# Patient Record
Sex: Female | Born: 1940 | Race: Black or African American | Hispanic: No | Smoking: Former smoker
Health system: Southern US, Community
[De-identification: ages and names within clinical notes are randomized; demographics above are authoritative.]

## PROBLEM LIST (undated history)

## (undated) DIAGNOSIS — I10 Essential (primary) hypertension: Secondary | ICD-10-CM

## (undated) HISTORY — PX: CHOLECYSTECTOMY: SHX55

## (undated) HISTORY — PX: ABDOMINAL HYSTERECTOMY: SHX81

---

## 2018-05-27 ENCOUNTER — Other Ambulatory Visit: Payer: Self-pay

## 2018-05-27 ENCOUNTER — Emergency Department (HOSPITAL_BASED_OUTPATIENT_CLINIC_OR_DEPARTMENT_OTHER)
Admission: EM | Admit: 2018-05-27 | Discharge: 2018-05-28 | Disposition: A | Discharge status: Home / Self Care | Payer: Medicare Other | Attending: Emergency Medicine | Admitting: Emergency Medicine

## 2018-05-27 ENCOUNTER — Emergency Department (HOSPITAL_BASED_OUTPATIENT_CLINIC_OR_DEPARTMENT_OTHER): Payer: Medicare Other

## 2018-05-27 ENCOUNTER — Encounter (HOSPITAL_BASED_OUTPATIENT_CLINIC_OR_DEPARTMENT_OTHER): Payer: Self-pay | Admitting: *Deleted

## 2018-05-27 DIAGNOSIS — Z87891 Personal history of nicotine dependence: Secondary | ICD-10-CM | POA: Diagnosis not present

## 2018-05-27 DIAGNOSIS — Y92009 Unspecified place in unspecified non-institutional (private) residence as the place of occurrence of the external cause: Secondary | ICD-10-CM | POA: Insufficient documentation

## 2018-05-27 DIAGNOSIS — S8001XA Contusion of right knee, initial encounter: Secondary | ICD-10-CM | POA: Diagnosis not present

## 2018-05-27 DIAGNOSIS — Z7982 Long term (current) use of aspirin: Secondary | ICD-10-CM | POA: Insufficient documentation

## 2018-05-27 DIAGNOSIS — I10 Essential (primary) hypertension: Secondary | ICD-10-CM | POA: Insufficient documentation

## 2018-05-27 DIAGNOSIS — Y999 Unspecified external cause status: Secondary | ICD-10-CM | POA: Insufficient documentation

## 2018-05-27 DIAGNOSIS — W228XXA Striking against or struck by other objects, initial encounter: Secondary | ICD-10-CM | POA: Insufficient documentation

## 2018-05-27 DIAGNOSIS — Y9301 Activity, walking, marching and hiking: Secondary | ICD-10-CM | POA: Diagnosis not present

## 2018-05-27 DIAGNOSIS — S8991XA Unspecified injury of right lower leg, initial encounter: Secondary | ICD-10-CM | POA: Diagnosis present

## 2018-05-27 HISTORY — DX: Essential (primary) hypertension: I10

## 2018-05-27 NOTE — ED Triage Notes (Signed)
Pt c/o right knee injury x 4 days ago

## 2018-05-28 ENCOUNTER — Emergency Department (HOSPITAL_BASED_OUTPATIENT_CLINIC_OR_DEPARTMENT_OTHER): Payer: Medicare Other

## 2018-05-28 DIAGNOSIS — S8001XA Contusion of right knee, initial encounter: Secondary | ICD-10-CM | POA: Diagnosis not present

## 2018-05-28 MED ORDER — NAPROXEN 500 MG PO TABS: 500.0000 mg | ORAL_TABLET | Freq: Two times a day (BID) | ORAL | 0 refills | Status: DC | PRN

## 2018-05-28 MED ORDER — HYDROCODONE-ACETAMINOPHEN 5-325 MG PO TABS
1.0000 | ORAL_TABLET | Freq: Once | ORAL | Status: AC
  Administered 2018-05-28: 1 via ORAL
  Filled 2018-05-28: qty 1

## 2018-05-28 NOTE — ED Provider Notes (Signed)
[facility] EMERGENCY DEPARTMENT Provider Note   CSN: [id number] Arrival date & time: 05/27/18  2036     History   Chief Complaint Chief Complaint  Patient presents with  . Knee Injury    HPI Emily Barrett is a 77 y.o. female.  Patient with history of hypertension and knee arthritis presenting with worsening right knee pain after injuring it 4 days ago.  States she ran into a wooden coffee table with her right knee has been having severe pain since that is not controlled with Tylenol.  Denies falling or hitting her head.  No other injuries.  No neck or back pain.  Knee pain is on her anterior and lateral knee.  She is had intermittent issues with this knee in the past and told she would need a replacement.  Denies any hip pain or ankle pain.  The history is provided by the patient.    Past Medical History:  Diagnosis Date  . Hypertension     There are no active problems to display for this patient.   Past Surgical History:  Procedure Laterality Date  . ABDOMINAL HYSTERECTOMY    . CHOLECYSTECTOMY       OB History   None      Home Medications    Prior to Admission medications   Medication Sig Start Date End Date Taking? Authorizing Provider  lisinopril (PRINIVIL,ZESTRIL) 40 MG tablet TAKE 1 TABLET(40 MG) BY MOUTH DAILY 05/14/18  Yes [provider]  metoprolol tartrate (LOPRESSOR) 50 MG tablet Take by mouth. 12/25/17  Yes [provider]  aspirin EC 81 MG tablet Take by mouth.    [provider]  hydrochlorothiazide (HYDRODIURIL) 12.5 MG tablet Take by mouth.    [provider]    Family History History reviewed. No pertinent family history.  Social History Social History   Tobacco Use  . Smoking status: Former Games developer  . Smokeless tobacco: Never Used  Substance Use Topics  . Alcohol use: Not Currently  . Drug use: Not Currently     Allergies   Patient has no known allergies.   Review of Systems Review  of Systems  Constitutional: Negative for activity change, appetite change and fever.  HENT: Negative for congestion and rhinorrhea.   Eyes: Negative for visual disturbance.  Respiratory: Negative for cough, chest tightness and shortness of breath.   Cardiovascular: Negative for chest pain.  Gastrointestinal: Negative for abdominal pain, nausea and vomiting.  Genitourinary: Negative for dysuria, hematuria, vaginal bleeding and vaginal discharge.  Musculoskeletal: Positive for arthralgias and myalgias. Negative for back pain and neck pain.  Neurological: Negative for dizziness, weakness, light-headedness and headaches.    all other systems are negative except as noted in the HPI and PMH.    Physical Exam Updated Vital Signs BP (!) 160/89 (BP Location: Right Arm)   Pulse 68   Temp 98.6 F (37 C) (Oral)   Resp 18   Ht 5\' 5"  (1.651 m)   Wt 93 kg   SpO2 100%   BMI 34.11 kg/m   Physical Exam  Constitutional: She is oriented to person, place, and time. She appears well-developed and well-nourished. No distress.  HENT:  Head: Normocephalic and atraumatic.  Mouth/Throat: Oropharynx is clear and moist. No oropharyngeal exudate.  Eyes: Pupils are equal, round, and reactive to light. Conjunctivae and EOM are normal.  Neck: Normal range of motion. Neck supple.  No meningismus.  Cardiovascular: Normal rate, regular rhythm, normal heart sounds and intact distal pulses.  No murmur heard. Pulmonary/Chest: Effort normal and breath sounds normal. No respiratory distress.  Abdominal: Soft. There is no tenderness. There is no rebound and no guarding.  Musculoskeletal: Normal range of motion. She exhibits tenderness. She exhibits no edema.  Right knee has anterior and lateral joint line tenderness.  No significant effusion.  Pain with range of motion.  Drawer sign is negative.  Patient able to lift leg and keep knee extended. Intact DP and PT pulses. Tenderness to palpation across the tibial  plateau  Neurological: She is alert and oriented to person, place, and time. No cranial nerve deficit. She exhibits normal muscle tone. Coordination normal.  No ataxia on finger to nose bilaterally. No pronator drift. 5/5 strength throughout. CN 2-12 intact.Equal grip strength. Sensation intact.   Skin: Skin is warm.  Psychiatric: She has a normal mood and affect. Her behavior is normal.  Nursing note and vitals reviewed.    ED Treatments / Results  Labs (all labs ordered are listed, but only abnormal results are displayed) Labs Reviewed - No data to display  EKG None  Radiology Ct Knee Right Wo Contrast  Result Date: 05/28/2018 CLINICAL DATA:  Knee trauma, tenderness or effusion or can't walk, initial exam. Right knee injury 4 days ago. EXAM: CT OF THE RIGHT KNEE WITHOUT CONTRAST TECHNIQUE: Multidetector CT imaging of the RIGHT knee was performed according to the standard protocol. Multiplanar CT image reconstructions were also generated. COMPARISON:  Radiographs 3 hours prior. FINDINGS: Bones/Joint/Cartilage No fracture or dislocation. Advanced tricompartmental spurring with spurring of the tibial spines. Prominent medial tibiofemoral and patellofemoral joint space narrowing. Intra-articular bodies anteriorly in the joint. Subchondral cysts at the intercondylar notch. Chronic bony fragmentation about the proximal tibia/fibular articulation. Small joint effusion without lipohemarthrosis. Ligaments Suboptimally assessed by CT. Muscles and Tendons No intramuscular hematoma. Patellar tendon is intact. Mild thickening of the quadriceps tendon which is likely chronic. Soft tissues Soft tissue edema anteriorly.  No confluent hematoma. IMPRESSION: 1. 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 Intra-articular body anteriorly. 2. Small joint effusion. Electronically Signed   By: Narda Rutherford M.D.   On: 05/28/2018 01:25   Dg Knee Complete 4 Views Right  Result Date:  05/27/2018 CLINICAL DATA:  Pain. EXAM: RIGHT KNEE - COMPLETE 4+ VIEW COMPARISON:  None. FINDINGS: No significant joint effusion. Moderate to severe tricompartmental degenerative changes. No acute fractures noted. IMPRESSION: Moderate to severe tricompartmental degenerative changes. Electronically Signed   By: Gerome Sam III M.D   On: 05/27/2018 21:36    Procedures Procedures (including critical care time)  Medications Ordered in ED Medications  HYDROcodone-acetaminophen (NORCO/VICODIN) 5-325 MG per tablet 1 tablet (has no administration in time range)     Initial Impression / Assessment and Plan / ED Course  I have reviewed the triage vital signs and the nursing notes.  Pertinent labs & imaging results that were available during my care of the patient were reviewed by me and considered in my medical decision making (see chart for details).    Right knee pain after direct trauma several days ago.  Neurovascularly intact.  X-rays show severe osteoarthritis without acute pathology  CT scan is also negative for fracture or dislocation.  Quadriceps and patellar tendon appears to be intact.  Patient given knee sleeve, anti-inflammatories and follow-up with her orthopedist.  Return precautions discussed. Final Clinical Impressions(s) / ED Diagnoses   Final diagnoses:  Contusion of right knee, initial encounter    ED Discharge Orders    None  Glynn Octave, MD 05/28/18 [phone]

## 2018-05-28 NOTE — Discharge Instructions (Addendum)
Your testing does not show any fracture.  Follow-up with your orthopedic specialist.  Return to the ED if you develop new or worsening symptoms.

## 2018-06-09 ENCOUNTER — Emergency Department (HOSPITAL_BASED_OUTPATIENT_CLINIC_OR_DEPARTMENT_OTHER): Payer: Medicare Other

## 2018-06-09 ENCOUNTER — Other Ambulatory Visit: Payer: Self-pay

## 2018-06-09 ENCOUNTER — Encounter (HOSPITAL_BASED_OUTPATIENT_CLINIC_OR_DEPARTMENT_OTHER): Payer: Self-pay

## 2018-06-09 ENCOUNTER — Observation Stay (HOSPITAL_BASED_OUTPATIENT_CLINIC_OR_DEPARTMENT_OTHER)
Admission: EM | Admit: 2018-06-09 | Discharge: 2018-06-11 | Disposition: A | Discharge status: Home / Self Care | Payer: Medicare Other | Attending: Internal Medicine | Admitting: Internal Medicine

## 2018-06-09 ENCOUNTER — Observation Stay (HOSPITAL_COMMUNITY): Payer: Medicare Other

## 2018-06-09 DIAGNOSIS — G9341 Metabolic encephalopathy: Secondary | ICD-10-CM | POA: Diagnosis not present

## 2018-06-09 DIAGNOSIS — Z79899 Other long term (current) drug therapy: Secondary | ICD-10-CM | POA: Diagnosis not present

## 2018-06-09 DIAGNOSIS — G459 Transient cerebral ischemic attack, unspecified: Secondary | ICD-10-CM | POA: Diagnosis not present

## 2018-06-09 DIAGNOSIS — I10 Essential (primary) hypertension: Secondary | ICD-10-CM | POA: Diagnosis not present

## 2018-06-09 DIAGNOSIS — R4182 Altered mental status, unspecified: Secondary | ICD-10-CM | POA: Diagnosis present

## 2018-06-09 DIAGNOSIS — Z87891 Personal history of nicotine dependence: Secondary | ICD-10-CM | POA: Diagnosis not present

## 2018-06-09 LAB — PROTIME-INR
INR: 0.99
PROTHROMBIN TIME: 13 s (ref 11.4–15.2)

## 2018-06-09 LAB — COMPREHENSIVE METABOLIC PANEL
ALT: 17 U/L (ref 0–44)
ANION GAP: 10 (ref 5–15)
AST: 21 U/L (ref 15–41)
Albumin: 4 g/dL (ref 3.5–5.0)
Alkaline Phosphatase: 60 U/L (ref 38–126)
BUN: 19 mg/dL (ref 8–23)
CHLORIDE: 105 mmol/L (ref 98–111)
CO2: 26 mmol/L (ref 22–32)
Calcium: 9.8 mg/dL (ref 8.9–10.3)
Creatinine, Ser: 0.61 mg/dL (ref 0.44–1.00)
GFR calc non Af Amer: >60 mL/min (ref >60)
Glucose, Bld: 103 mg/dL — ABNORMAL HIGH (ref 70–99)
Potassium: 4.2 mmol/L (ref 3.5–5.1)
Sodium: 141 mmol/L (ref 135–145)
Total Bilirubin: 0.8 mg/dL (ref 0.3–1.2)
Total Protein: 7.8 g/dL (ref 6.5–8.1)

## 2018-06-09 LAB — URINALYSIS, ROUTINE W REFLEX MICROSCOPIC
Bilirubin Urine: NEGATIVE
GLUCOSE, UA: NEGATIVE mg/dL
HGB URINE DIPSTICK: NEGATIVE
Ketones, ur: NEGATIVE mg/dL
LEUKOCYTES UA: NEGATIVE
Nitrite: NEGATIVE
PROTEIN: 100 mg/dL — AB
Specific Gravity, Urine: 1.025 (ref 1.005–1.030)
pH: 6 (ref 5.0–8.0)

## 2018-06-09 LAB — URINALYSIS, MICROSCOPIC (REFLEX)

## 2018-06-09 LAB — RAPID URINE DRUG SCREEN, HOSP PERFORMED
AMPHETAMINES: NOT DETECTED
Barbiturates: NOT DETECTED
Benzodiazepines: NOT DETECTED
COCAINE: NOT DETECTED
Opiates: NOT DETECTED
TETRAHYDROCANNABINOL: NOT DETECTED

## 2018-06-09 LAB — ETHANOL

## 2018-06-09 LAB — CBG MONITORING, ED: Glucose-Capillary: 99 mg/dL (ref 70–99)

## 2018-06-09 LAB — DIFFERENTIAL
Abs Immature Granulocytes: 0.02 10*3/uL (ref 0.00–0.07)
BASOS ABS: 0 10*3/uL (ref 0.0–0.1)
BASOS PCT: 0 %
EOS PCT: 1 %
Eosinophils Absolute: 0.1 10*3/uL (ref 0.0–0.5)
Immature Granulocytes: 0 %
Lymphocytes Relative: 52 %
Lymphs Abs: 3.8 10*3/uL (ref 0.7–4.0)
MONO ABS: 0.8 10*3/uL (ref 0.1–1.0)
Monocytes Relative: 11 %
NEUTROS ABS: 2.6 10*3/uL (ref 1.7–7.7)
NEUTROS PCT: 36 %

## 2018-06-09 LAB — CBC
HCT: 47.9 % — ABNORMAL HIGH (ref 36.0–46.0)
HEMOGLOBIN: 14.9 g/dL (ref 12.0–15.0)
MCH: 28.5 pg (ref 26.0–34.0)
MCHC: 31.1 g/dL (ref 30.0–36.0)
MCV: 91.6 fL (ref 80.0–100.0)
Platelets: 284 10*3/uL (ref 150–400)
RBC: 5.23 MIL/uL — ABNORMAL HIGH (ref 3.87–5.11)
RDW: 14.5 % (ref 11.5–15.5)
WBC: 7.3 10*3/uL (ref 4.0–10.5)
nRBC: 0 % (ref 0.0–0.2)

## 2018-06-09 LAB — APTT: aPTT: 24 seconds (ref 24–36)

## 2018-06-09 LAB — TROPONIN I: Troponin I: <0.03 ng/mL (ref <0.03)

## 2018-06-09 MED ORDER — ONDANSETRON HCL 4 MG/2ML IJ SOLN: 4.0000 mg | Freq: Three times a day (TID) | INTRAMUSCULAR | Status: DC | PRN

## 2018-06-09 MED ORDER — STROKE: EARLY STAGES OF RECOVERY BOOK
Freq: Once | Status: AC
  Administered 2018-06-09: 23:00:00

## 2018-06-09 MED ORDER — ACETAMINOPHEN 160 MG/5ML PO SOLN: 650.0000 mg | ORAL | Status: DC | PRN

## 2018-06-09 MED ORDER — HYDRALAZINE HCL 20 MG/ML IJ SOLN: 5.0000 mg | INTRAMUSCULAR | Status: DC | PRN

## 2018-06-09 MED ORDER — SENNOSIDES-DOCUSATE SODIUM 8.6-50 MG PO TABS: 1.0000 | ORAL_TABLET | Freq: Every evening | ORAL | Status: DC | PRN

## 2018-06-09 MED ORDER — ENOXAPARIN SODIUM 40 MG/0.4ML ~~LOC~~ SOLN
40.0000 mg | SUBCUTANEOUS | Status: DC
  Administered 2018-06-09 – 2018-06-10 (×2): 40 mg via SUBCUTANEOUS
  Filled 2018-06-09 (×2): qty 0.4

## 2018-06-09 MED ORDER — ASPIRIN 300 MG RE SUPP: 300.0000 mg | Freq: Every day | RECTAL | Status: DC

## 2018-06-09 MED ORDER — ATORVASTATIN CALCIUM 80 MG PO TABS
80.0000 mg | ORAL_TABLET | Freq: Every day | ORAL | Status: DC
  Administered 2018-06-09 – 2018-06-11 (×3): 80 mg via ORAL
  Filled 2018-06-09 (×3): qty 1

## 2018-06-09 MED ORDER — SODIUM CHLORIDE 0.9 % IV SOLN
INTRAVENOUS | Status: DC
  Administered 2018-06-09 – 2018-06-11 (×3): via INTRAVENOUS

## 2018-06-09 MED ORDER — ACETAMINOPHEN 650 MG RE SUPP: 650.0000 mg | RECTAL | Status: DC | PRN

## 2018-06-09 MED ORDER — ACETAMINOPHEN 325 MG PO TABS: 650.0000 mg | ORAL_TABLET | ORAL | Status: DC | PRN

## 2018-06-09 MED ORDER — ZOLPIDEM TARTRATE 5 MG PO TABS
5.0000 mg | ORAL_TABLET | Freq: Every evening | ORAL | Status: DC | PRN
  Administered 2018-06-09: 5 mg via ORAL
  Filled 2018-06-09: qty 1

## 2018-06-09 MED ORDER — ASPIRIN 325 MG PO TABS
325.0000 mg | ORAL_TABLET | Freq: Every day | ORAL | Status: DC
  Administered 2018-06-10 – 2018-06-11 (×2): 325 mg via ORAL
  Filled 2018-06-09 (×2): qty 1

## 2018-06-09 NOTE — ED Notes (Signed)
Pt given microwave dinner, Pt family member approached this RN to report pt was having issues swallowing. Pt told to remain NPO until further notice. Pt breath sounds clear- pt denies any cough. Pt appears to be in NAD.

## 2018-06-09 NOTE — ED Notes (Signed)
Pt on monitor 

## 2018-06-09 NOTE — ED Notes (Signed)
[facility] arrived to transport pt to [facility] 

## 2018-06-09 NOTE — H&P (Addendum)
History and Physical    Emily Barrett WUJ:[id number] DOB: 1940-09-30 DOA: 06/09/2018  Referring MD/NP/PA:   PCP: Patient, No Pcp Per   Patient coming from:  The patient is coming from home.  At baseline, pt is independent for most of ADL.   Chief Complaint: AMS and difficulty speaking  HPI: Emily Barrett is a 77 y.o. female with medical history significant of hypertension, HOH, who presents with altered mental status and difficulty speaking.  Per her daughter, patient was last known normal at about 12:00, but she suddenly became confused at about 12:15.  She had difficult speaking out.  She did not have vision loss, facial droop, unilateral weakness, numbness or tingling his extremities.  Patient has chronic bilateral hearing loss, which has not changed.  Per her daughter, symptoms lasted for about 30 minutes, then resolved spontaneously.  Patient denies any chest pain, shortness breath, cough, fever or chills.  She had one loose stool bowel movement earlier, currently no nausea vomiting, diarrhea or abdominal pain.  Denies symptoms of UTI. When I saw pt on the floor, she is alert, oriented x3.  No unilateral weakness, facial droop, slurred speech.  No difficulty speaking.  ED Course: pt was found to have WBC 7.3, INR 0.99, PTT 24, negative troponin, negative urinalysis, alcohol level less than 10, electrolytes renal function okay, blood pressure 189/100, bradycardia, oxygen saturation 96% on room air, temperature normal.  Chest x-ray negative.  CT head is negative for acute intracranial abnormalities.  Patient is placed on telemetry bed of observation.  Neurology was consulted by EDP (Neurology (accepting MD did not Neurologist name).  Review of Systems:   General: no fevers, chills, no body weight gain, has fatigue HEENT: no blurry vision, hearing changes or sore throat Respiratory: no dyspnea, coughing, wheezing CV: no chest pain, no palpitations GI: no nausea, vomiting, abdominal pain,  diarrhea, constipation GU: no dysuria, burning on urination, increased urinary frequency, hematuria  Ext: no leg edema Neuro: has confusion and difficulty speaking. Skin: no rash, no skin tear. MSK: No muscle spasm, no deformity, no limitation of range of movement in spin Heme: No easy bruising.  Travel history: No recent long distant travel.  Allergy: No Known Allergies  Past Medical History:  Diagnosis Date  . Hypertension     Past Surgical History:  Procedure Laterality Date  . ABDOMINAL HYSTERECTOMY    . CHOLECYSTECTOMY      Social History:  reports that she has quit smoking. She has never used smokeless tobacco. She reports that she drank alcohol. She reports that she has current or past drug history.  Family History:  Family History  Problem Relation Age of Onset  . Dementia Mother   . Heart disease Father   . Diabetes Mellitus II Brother   . Hypertension Brother      Prior to Admission medications   Medication Sig Start Date End Date Taking? Authorizing Provider  aspirin EC 81 MG tablet Take by mouth.    [provider]  hydrochlorothiazide (HYDRODIURIL) 12.5 MG tablet Take by mouth.    [provider]  lisinopril (PRINIVIL,ZESTRIL) 40 MG tablet TAKE 1 TABLET(40 MG) BY MOUTH DAILY 05/14/18   [provider]  metoprolol tartrate (LOPRESSOR) 50 MG tablet Take by mouth. 12/25/17   [provider]  naproxen (NAPROSYN) 500 MG tablet Take 1 tablet (500 mg total) by mouth 2 (two) times daily as needed. 05/28/18   Glynn Octave, MD    Physical Exam: Vitals:   06/09/18 1700  06/09/18 1730 06/09/18 1848 06/09/18 1956  BP: (!) 151/81 (!) 164/78 (!) 184/88 (!) 197/100  Pulse: 66  80 77  Resp: (!) 21 14 12 12   Temp:   98.3 F (36.8 C) 99.4 F (37.4 C)  TempSrc:   Oral Oral  SpO2: 96% 96% 99% 100%  Weight:      Height:   5\' 5"  (1.651 m)    General: Not in acute distress HEENT:       Eyes: PERRL, EOMI, no scleral icterus.       ENT:  No discharge from the ears and nose, no pharynx injection, no tonsillar enlargement.        Neck: No JVD, no bruit, no mass felt. Heme: No neck lymph node enlargement. Cardiac: S1/S2, RRR, No murmurs, No gallops or rubs. Respiratory: No rales, wheezing, rhonchi or rubs. GI: Soft, nondistended, nontender, no rebound pain, no organomegaly, BS present. GU: No hematuria Ext: No pitting leg edema bilaterally. 2+DP/PT pulse bilaterally. Musculoskeletal: No joint deformities, No joint redness or warmth, no limitation of ROM in spin. Skin: No rashes.  Neuro: Alert, oriented X3, cranial nerves II-XII grossly intact, moves all extremities normally. Muscle strength 5/5 in all extremities, sensation to light touch intact. Brachial reflex 2+ bilaterally. Negative Babinski's sign.  Psych: Patient is not psychotic, no suicidal or hemocidal ideation.  Labs on Admission: I have personally reviewed following labs and imaging studies  CBC: Recent Labs  Lab 06/09/18 1339  WBC 7.3  NEUTROABS 2.6  HGB 14.9  HCT 47.9*  MCV 91.6  PLT 284   Basic Metabolic Panel: Recent Labs  Lab 06/09/18 1339  NA 141  K 4.2  CL 105  CO2 26  GLUCOSE 103*  BUN 19  CREATININE 0.61  CALCIUM 9.8   GFR: Estimated Creatinine Clearance: 67.9 mL/min (by C-G formula based on SCr of 0.61 mg/dL). Liver Function Tests: Recent Labs  Lab 06/09/18 1339  AST 21  ALT 17  ALKPHOS 60  BILITOT 0.8  PROT 7.8  ALBUMIN 4.0   No results for input(s): LIPASE, AMYLASE in the last 168 hours. No results for input(s): AMMONIA in the last 168 hours. Coagulation Profile: Recent Labs  Lab 06/09/18 1339  INR 0.99   Cardiac Enzymes: Recent Labs  Lab 06/09/18 1339  TROPONINI <0.03   BNP (last 3 results) No results for input(s): PROBNP in the last 8760 hours. HbA1C: No results for input(s): HGBA1C in the last 72 hours. CBG: Recent Labs  Lab 06/09/18 1328  GLUCAP 99   Lipid Profile: No results for input(s): CHOL, HDL,  LDLCALC, TRIG, CHOLHDL, LDLDIRECT in the last 72 hours. Thyroid Function Tests: No results for input(s): TSH, T4TOTAL, FREET4, T3FREE, THYROIDAB in the last 72 hours. Anemia Panel: No results for input(s): VITAMINB12, FOLATE, FERRITIN, TIBC, IRON, RETICCTPCT in the last 72 hours. Urine analysis:    Component Value Date/Time   COLORURINE YELLOW 06/09/2018 1330   APPEARANCEUR CLEAR 06/09/2018 1330   LABSPEC 1.025 06/09/2018 1330   PHURINE 6.0 06/09/2018 1330   GLUCOSEU NEGATIVE 06/09/2018 1330   HGBUR NEGATIVE 06/09/2018 1330   BILIRUBINUR NEGATIVE 06/09/2018 1330   KETONESUR NEGATIVE 06/09/2018 1330   PROTEINUR 100 (A) 06/09/2018 1330   NITRITE NEGATIVE 06/09/2018 1330   LEUKOCYTESUR NEGATIVE 06/09/2018 1330   Sepsis Labs: @LABRCNTIP (procalcitonin:4,lacticidven:4) )No results found for this or any previous visit (from the past 240 hour(s)).   Radiological Exams on Admission: Dg Chest 2 View  Result Date: 06/09/2018 CLINICAL DATA:  Increased confusion EXAM:  CHEST - 2 VIEW COMPARISON:  None. FINDINGS: Cardiac shadow is at the upper limits of normal in size. The lungs are well aerated bilaterally on the frontal film but somewhat hypoaerated on the lateral suggesting basilar atelectasis. This is felt to be related to the poor inspiratory effort. No bony abnormality is seen. Degenerative change of thoracic spine is noted. IMPRESSION: Poor inspiratory effort on the lateral projection. No acute abnormality noted. Electronically Signed   By: Alcide Clever M.D.   On: 06/09/2018 16:11   Ct Head Wo Contrast  Result Date: 06/09/2018 CLINICAL DATA:  Focal neuro deficit. Altered mental status confusion EXAM: CT HEAD WITHOUT CONTRAST TECHNIQUE: Contiguous axial images were obtained from the base of the skull through the vertex without intravenous contrast. COMPARISON:  CT head 12/25/2017 FINDINGS: Brain: Progressive moderate atrophy. Progression of diffuse white matter hypodensity. No mass-effect or  midline shift. Negative for acute infarct, hemorrhage, or mass. Sella remains markedly enlarged and filled with CSF. No pituitary tissue identified. Findings consistent with empty sella. Vascular: Negative for hyperdense vessel Skull: Negative Sinuses/Orbits: Paranasal sinuses clear.  Normal orbit. Other: None IMPRESSION: Since 01/14/2018, there has been progression of atrophy and diffuse white matter disease. No acute abnormality. Consider MRI head without and with contrast for further evaluation. Electronically Signed   By: Marlan Palau M.D.   On: 06/09/2018 14:01     EKG: Independently reviewed.  Sinus rhythm, QTC 475, PVC, early R wave progression Assessment/Plan Principal Problem:   TIA (transient ischemic attack) Active Problems:   Acute metabolic encephalopathy   Essential hypertension   TIA (transient ischemic attack): Patient had transient confusion and difficulty speaking, which is resolved in 30 minutes.  Concerning for TIA.  CT head showed atrophy and diffuse white matter disease, but no acute intracranial abnormalities.  Neurology was consulted by EDP (Neurology (accepting MD did not Neurologist name).  - will place on tele bed for obs - will follow up Neurology's Recs.  - Obtain MRI/MRA  - allow permissive HTN in the setting of acute stroke  - Check carotid dopplers  - start Lipitor 80 mg daily and ASA - fasting lipid panel and HbA1c  - 2D transthoracic echocardiography  - swallowing screen. If fails, will get SLP - Check UDS  - PT/OT consult  Addendum: MRI negative for stroke. Dr. Amada Jupiter suspect patient may have complex partial seizure.  He ordered EEG. -Seizure precaution -PRN Ativan for seizure  Acute metabolic encephalopathy: due to TIA, resolved. -Frequent neuro check  Essential hypertension: -Hold home blood pressure medications to allow permissive hypertension -IV hydralazine PRN for BP>220/120   DVT ppx:   SQ Lovenox Code Status: Full code Family  Communication:  Yes, patient's 4 daughters, and son-in-law   at bed side Disposition Plan:  Anticipate discharge back to previous home environment Consults called: Neurology (accepting MD did not Neurologist name) Admission status: Obs / tele   Date of Service 06/09/2018    Emily Barrett [facility] Pager [phone]  If 7PM-7AM, please contact night-coverage [email or web address] Password [facility] 06/09/2018, 9:13 PM

## 2018-06-09 NOTE — ED Notes (Signed)
Pt up to bedside commode. MD in to see patient. Plan for admission.

## 2018-06-09 NOTE — Consult Note (Signed)
Neurology Consultation Reason for Consult: Transient episode of confusion Referring Physician: Jarvis Morgan  CC: Episode of confusion  History is obtained from: Patient, sister  HPI: Emily Barrett is a 77 y.o. female with a history of hypertension who presents with transient episode of confusion.  She was sitting at the breakfast table talking to her daughter when she had abrupt cessation and speaking.  Her daughter states that she was just looking around like she was lost, not fixedly staring, and no gaze deviation but she did seem to prefer looking towards the right.  She did not say anything for a couple of minutes and then when she began speaking she seemed to have some difficulty.  Her daughter asked her to scratch her back and the patient instead just picked at the clothing where the daughter had indicated to scratch.  She got her into the car to take her back to her house and she continued to have some difficulty with speaking during this.  She got her back to the daughter's house which point the patient spoke to the daughter's husband and answered appropriately.  She estimates that the total time from onset of symptoms to resolution of symptoms was a little bit over 10 minutes.  The patient states that she is completely amnestic to the episode.  She remembers sitting at the breakfast table with her sister, and then the next thing she remembers is being at her sister's house.  She does not remember getting into the car or any transit there.   Per family, she has been having some difficulty with short-term memory problems.  She does have a history of dementia in the family.    ROS: A 14 point ROS was performed and is negative except as noted in the HPI.  Past Medical History:  Diagnosis Date  . Hypertension      Family History  Problem Relation Age of Onset  . Dementia Mother   . Heart disease Father   . Diabetes Mellitus II Brother   . Hypertension Brother      Social History:   reports that she has quit smoking. She has never used smokeless tobacco. She reports that she drank alcohol. She reports that she has current or past drug history.   Exam: Current vital signs: BP (!) 182/87 (BP Location: Right Arm)   Pulse 78   Temp 98.7 F (37.1 C) (Oral)   Resp 18   Ht 5\' 5"  (1.651 m)   Wt 94.3 kg   SpO2 98%   BMI 34.61 kg/m  Vital signs in last 24 hours: Temp:  [98.2 F (36.8 C)-99.4 F (37.4 C)] 98.7 F (37.1 C) (10/15 2335) Pulse Rate:  [58-80] 78 (10/15 2335) Resp:  [12-25] 18 (10/15 2335) BP: (151-197)/(59-100) 182/87 (10/15 2335) SpO2:  [96 %-100 %] 98 % (10/15 2335) Weight:  [94.3 kg] 94.3 kg (10/15 1314)   Physical Exam  Constitutional: Appears well-developed and well-nourished.  Psych: Affect appropriate to situation Eyes: No scleral injection HENT: No OP obstrucion Head: Normocephalic.  Cardiovascular: Normal rate and regular rhythm.  Respiratory: Effort normal, non-labored breathing GI: Soft.  No distension. There is no tenderness.  Skin: WDI  Neuro: Mental Status: Patient is awake, alert, oriented to person, place, month, year, and situation. Patient is able to give a clear and coherent history. No signs of aphasia or neglect Cranial Nerves: II: Visual Fields are full. Pupils are equal, round, and reactive to light.   III,IV, VI: EOMI without ptosis or  diploplia.  V: Facial sensation is symmetric to temperature VII: Facial movement is symmetric.  VIII: hearing is intact to voice X: Uvula elevates symmetrically XI: Shoulder shrug is symmetric. XII: tongue is midline without atrophy or fasciculations.  Motor: Tone is normal. Bulk is normal. 5/5 strength was present in all four extremities.  Plantars: Toes are downgoing bilaterally.  Cerebellar: FNF intact bilaterally   I have reviewed labs in epic and the results pertinent to this consultation are: CMP-unremarkable  I have reviewed the images obtained: MRI brain-no acute  infarct  Impression: 77 year old female who presents with transient episode of behavioral arrest.  The fact that she is completely amnestic to the episode would argue much more in favor of complex partial seizure as opposed to transient ischemic attack.  No evidence of hypoglycemia, hypotension, or other etiology that is apparent to me at this time.  If an EEG does demonstrate a seizure predisposition, then I would start antiepileptic therapy.  Given that there is some doubt about the diagnosis, I do not think that I would start antiepileptic without further evidence of a seizure predisposition.  Recommendations: 1) EEG 2) I have discussed no driving with the patient no driving for 6 months. 3) she is already on antiplatelet therapy with aspirin.  Dopplers and echo are pending, though unless there was evidence on the studies that she was at very high risk, I suspect that TIA is less likely. 4) she does have intracranial atherosclerosis, and I would consider statin therapy if she has an elevated LDL.  Ritta Slot, MD [facility] [phone]  If 7pm- 7am, please page neurology on call as listed in AMION.

## 2018-06-09 NOTE — ED Notes (Signed)
Patient transported to X-ray 

## 2018-06-09 NOTE — ED Notes (Signed)
ED Provider at bedside. 

## 2018-06-09 NOTE — ED Notes (Signed)
Eugenie Norrie RN on [address] informed pt is now NPO

## 2018-06-09 NOTE — ED Notes (Signed)
Patient transported to CT 

## 2018-06-09 NOTE — ED Triage Notes (Signed)
Per daughter she started a phone conversation with pt at 12p-pt was WNL-by 1215p pt seemed confused and unable to get words out-pt entered triage with daughter/slow steady gait-pt is alert to name, DOB-states she had a bday yesterday-answers ?s appropriately with daughter filling in gaps-denies pain-pt taken to tx area via w/c

## 2018-06-10 ENCOUNTER — Observation Stay (HOSPITAL_BASED_OUTPATIENT_CLINIC_OR_DEPARTMENT_OTHER): Payer: Medicare Other

## 2018-06-10 ENCOUNTER — Observation Stay (HOSPITAL_COMMUNITY): Payer: Medicare Other

## 2018-06-10 DIAGNOSIS — G9341 Metabolic encephalopathy: Secondary | ICD-10-CM | POA: Diagnosis not present

## 2018-06-10 DIAGNOSIS — G459 Transient cerebral ischemic attack, unspecified: Secondary | ICD-10-CM

## 2018-06-10 DIAGNOSIS — I10 Essential (primary) hypertension: Secondary | ICD-10-CM | POA: Diagnosis not present

## 2018-06-10 LAB — LIPID PANEL
CHOL/HDL RATIO: 3.9 ratio
CHOLESTEROL: 140 mg/dL (ref 0–200)
HDL: 36 mg/dL — ABNORMAL LOW (ref >40)
LDL Cholesterol: 87 mg/dL (ref 0–99)
Triglycerides: 84 mg/dL (ref <150)
VLDL: 17 mg/dL (ref 0–40)

## 2018-06-10 LAB — ECHOCARDIOGRAM COMPLETE
Height: 65 in
WEIGHTICAEL: 3328 [oz_av]

## 2018-06-10 LAB — HEMOGLOBIN A1C
Hgb A1c MFr Bld: 6.1 % — ABNORMAL HIGH (ref 4.8–5.6)
Mean Plasma Glucose: 128.37 mg/dL

## 2018-06-10 MED ORDER — METOPROLOL TARTRATE 50 MG PO TABS
50.0000 mg | ORAL_TABLET | Freq: Two times a day (BID) | ORAL | Status: DC
  Administered 2018-06-10 – 2018-06-11 (×2): 50 mg via ORAL
  Filled 2018-06-10 (×2): qty 1

## 2018-06-10 MED ORDER — LORAZEPAM 2 MG/ML IJ SOLN
1.0000 mg | INTRAMUSCULAR | Status: DC | PRN
  Administered 2018-06-10: 1 mg via INTRAVENOUS
  Filled 2018-06-10 (×2): qty 1

## 2018-06-10 MED ORDER — LORAZEPAM 2 MG/ML IJ SOLN: 1.0000 mg | INTRAMUSCULAR | Status: DC | PRN

## 2018-06-10 MED ORDER — ATORVASTATIN CALCIUM 80 MG PO TABS: 80.0000 mg | ORAL_TABLET | Freq: Every day | ORAL | 0 refills | Status: DC

## 2018-06-10 MED ORDER — LISINOPRIL 20 MG PO TABS
40.0000 mg | ORAL_TABLET | Freq: Every day | ORAL | Status: DC
  Administered 2018-06-10 – 2018-06-11 (×2): 40 mg via ORAL
  Filled 2018-06-10 (×2): qty 2

## 2018-06-10 MED ORDER — HYDROCHLOROTHIAZIDE 25 MG PO TABS
12.5000 mg | ORAL_TABLET | Freq: Every day | ORAL | Status: DC
  Administered 2018-06-10 – 2018-06-11 (×2): 12.5 mg via ORAL
  Filled 2018-06-10 (×2): qty 1

## 2018-06-10 NOTE — Progress Notes (Signed)
EEG complete - results pending 

## 2018-06-10 NOTE — Procedures (Signed)
ELECTROENCEPHALOGRAM REPORT   Patient: Emily Barrett       Room #: 1O10R EEG No. ID: [id number] Age: 77 y.o.        Sex: female Referring Physician: Margo Aye Report Date:  06/10/2018        Interpreting Physician: Thana Farr  History: Emily Barrett is an 77 y.o. female with an episode of confusion  Medications:  ASA, Lipitor  Conditions of Recording:  This is a 21 channel routine scalp EEG performed with bipolar and monopolar montages arranged in accordance to the international 10/20 system of electrode placement. One channel was dedicated to EKG recording.  The patient is in the awake and drowsy states.  Description:  The waking background activity consists of a low voltage, symmetrical, fairly well organized, 10 Hz alpha activity, seen from the parieto-occipital and posterior temporal regions.  Low voltage fast activity, poorly organized, is seen anteriorly and is at times superimposed on more posterior regions.  A mixture of theta and alpha rhythms are seen from the central and temporal regions. The patient drowses with slowing to irregular, low voltage theta and beta activity.   Stage II sleep is not obtained. No epileptiform activity is noted.   Hyperventilation and intermittent photic stimulation were not performed.   IMPRESSION: Normal electroencephalogram, awake and drowsy. There are no focal lateralizing or epileptiform features.   Thana Farr, MD Neurology [phone] 06/10/2018, 11:04 AM

## 2018-06-10 NOTE — Progress Notes (Signed)
2D Echocardiogram has been performed.  Pieter Partridge 06/10/2018, 9:18 AM

## 2018-06-10 NOTE — Progress Notes (Signed)
*  Preliminary Results* Carotid artery duplex has been completed. Bilateral internal carotid arteries are 1-39% stenosis. Vertebral arteries are patent with antegrade flow.  06/10/2018 9:07 AM  Emily Barrett

## 2018-06-10 NOTE — Discharge Instructions (Signed)
Transient Ischemic Attack °A transient ischemic attack (TIA) is a "warning stroke" that causes stroke-like symptoms. A TIA does not cause lasting damage to the brain. The symptoms of a TIA can happen fast and do not last long. It is important to know the symptoms of a TIA and what to do. This can help prevent stroke or death. °Follow these instructions at home: °· Take medicines only as told by your doctor. Make sure you understand all of the instructions. °· You may need to take aspirin or warfarin medicine. Warfarin needs to be taken exactly as told. °? Taking too much or too little warfarin is dangerous. Blood tests must be done as often as told by your doctor. A PT blood test measures how long it takes for blood to clot. Your PT is used to calculate another value called an INR. Your PT and INR help your doctor adjust your warfarin dosage. He or she will make sure you are taking the right amount. °? Food can cause problems with warfarin and affect the results of your blood tests. This is true for foods high in vitamin K. Eat the same amount of foods high in vitamin K each day. Foods high in vitamin K include spinach, kale, broccoli, cabbage, collard and turnip greens, Brussels sprouts, peas, cauliflower, seaweed, and parsley. Other foods high in vitamin K include beef and pork liver, green tea, and soybean oil. Eat the same amount of foods high in vitamin K each day. Avoid big changes in your diet. Tell your doctor before changing your diet. Talk to a food specialist (dietitian) if you have questions. °? Many medicines can cause problems with warfarin and affect your PT and INR. Tell your doctor about all medicines you take. This includes vitamins and dietary pills (supplements). Do not take or stop taking any prescribed or over-the-counter medicines unless your doctor tells you to. °? Warfarin can cause more bruising or bleeding. Hold pressure over any cuts for longer than normal. Talk to your doctor about other  side effects of warfarin. °? Avoid sports or activities that may cause injury or bleeding. °? Be careful when you shave, floss, or use sharp objects. °? Avoid or drink very little alcohol while taking warfarin. Tell your doctor if you change how much alcohol you drink. °? Tell your dentist and other doctors that you take warfarin before any procedures. °· Follow your diet program as told, if you are given one. °· Keep a healthy weight. °· Stay active. Try to get at least 30 minutes of activity on all or most days. °· Do not use any tobacco products, including cigarettes, chewing tobacco, or electronic cigarettes. If you need help quitting, ask your doctor. °· Limit alcohol intake to no more than 1 drink per day for nonpregnant women and 2 drinks per day for men. One drink equals 12 ounces of beer, 5 ounces of wine, or 1½ ounces of hard liquor. °· Do not abuse drugs. °· Keep your home safe so you do not fall. You can do this by: °? Putting grab bars in the bedroom and bathroom. °? Raising toilet seats. °? Putting a seat in the shower. °· Keep all follow-up visits as told by your doctor. This is important. °Contact a doctor if: °· Your personality changes. °· You have trouble swallowing. °· You have double vision. °· You are dizzy. °· You have a fever. °Get help right away if: °These symptoms may be an emergency. Do not wait to see if   the symptoms will go away. Get medical help right away. Call your local emergency services (911 in the [place] Do not drive yourself to the hospital.  You have sudden weakness or lose feeling (go numb), especially on one side of the body. This can affect your: ? Face. ? Arm. ? Leg.  You have sudden trouble walking.  You have sudden trouble moving your arms or legs.  You have sudden confusion.  You have trouble talking.  You have trouble understanding.  You have sudden trouble seeing in one or both eyes.  You lose your balance.  Your movements are not smooth.  You  have a sudden, very bad headache with no known cause.  You have new chest pain.  Your heartbeat is unsteady.  You are partly or totally unaware of what is going on around you.  This information is not intended to replace advice given to you by your health care provider. Make sure you discuss any questions you have with your health care provider. Document Released: 05/21/2008 Document Revised: 04/15/2016 Document Reviewed: 11/17/2013 Elsevier Interactive Patient Education  2018 [employer].   Stroke Prevention Some medical conditions and behaviors are associated with a higher chance of having a stroke. You can help prevent a stroke by making nutrition, lifestyle, and other changes, including managing any medical conditions you may have. What nutrition changes can be made?  Eat healthy foods. You can do this by: ? Choosing foods high in fiber, such as fresh fruits and vegetables and whole grains. ? Eating at least 5 or more servings of fruits and vegetables a day. Try to fill half of your plate at each meal with fruits and vegetables. ? Choosing lean protein foods, such as lean cuts of meat, poultry without skin, fish, tofu, beans, and nuts. ? Eating low-fat dairy products. ? Avoiding foods that are high in salt (sodium). This can help lower blood pressure. ? Avoiding foods that have saturated fat, trans fat, and cholesterol. This can help prevent high cholesterol. ? Avoiding processed and premade foods.  Follow your health care provider's specific guidelines for losing weight, controlling high blood pressure (hypertension), lowering high cholesterol, and managing diabetes. These may include: ? Reducing your daily calorie intake. ? Limiting your daily sodium intake to 1,500 milligrams (mg). ? Using only healthy fats for cooking, such as olive oil, canola oil, or sunflower oil. ? Counting your daily carbohydrate intake. What lifestyle changes can be made?  Maintain a healthy weight.  Talk to your health care provider about your ideal weight.  Get at least 30 minutes of moderate physical activity at least 5 days a week. Moderate activity includes brisk walking, biking, and swimming.  Do not use any products that contain nicotine or tobacco, such as cigarettes and e-cigarettes. If you need help quitting, ask your health care provider. It may also be helpful to avoid exposure to secondhand smoke.  Limit alcohol intake to no more than 1 drink a day for nonpregnant women and 2 drinks a day for men. One drink equals 12 oz of beer, 5 oz of wine, or 1 oz of hard liquor.  Stop any illegal drug use.  Avoid taking birth control pills. Talk to your health care provider about the risks of taking birth control pills if: ? You are over 24 years old. ? You smoke. ? You get migraines. ? You have ever had a blood clot. What other changes can be made?  Manage your cholesterol levels. ? Eating a healthy  diet is important for preventing high cholesterol. If cholesterol cannot be managed through diet alone, you may also need to take medicines. ? Take any prescribed medicines to control your cholesterol as told by your health care provider.  Manage your diabetes. ? Eating a healthy diet and exercising regularly are important parts of managing your blood sugar. If your blood sugar cannot be managed through diet and exercise, you may need to take medicines. ? Take any prescribed medicines to control your diabetes as told by your health care provider.  Control your hypertension. ? To reduce your risk of stroke, try to keep your blood pressure below 130/80. ? Eating a healthy diet and exercising regularly are an important part of controlling your blood pressure. If your blood pressure cannot be managed through diet and exercise, you may need to take medicines. ? Take any prescribed medicines to control hypertension as told by your health care provider. ? Ask your health care provider if you  should monitor your blood pressure at home. ? Have your blood pressure checked every year, even if your blood pressure is normal. Blood pressure increases with age and some medical conditions.  Get evaluated for sleep disorders (sleep apnea). Talk to your health care provider about getting a sleep evaluation if you snore a lot or have excessive sleepiness.  Take over-the-counter and prescription medicines only as told by your health care provider. Aspirin or blood thinners (antiplatelets or anticoagulants) may be recommended to reduce your risk of forming blood clots that can lead to stroke.  Make sure that any other medical conditions you have, such as atrial fibrillation or atherosclerosis, are managed. What are the warning signs of a stroke? The warning signs of a stroke can be easily remembered as BEFAST.  B is for balance. Signs include: ? Dizziness. ? Loss of balance or coordination. ? Sudden trouble walking.  E is for eyes. Signs include: ? A sudden change in vision. ? Trouble seeing.  F is for face. Signs include: ? Sudden weakness or numbness of the face. ? The face or eyelid drooping to one side.  A is for arms. Signs include: ? Sudden weakness or numbness of the arm, usually on one side of the body.  S is for speech. Signs include: ? Trouble speaking (aphasia). ? Trouble understanding.  T is for time. ? These symptoms may represent a serious problem that is an emergency. Do not wait to see if the symptoms will go away. Get medical help right away. Call your local emergency services (911 in the [place] Do not drive yourself to the hospital.  Other signs of stroke may include: ? A sudden, severe headache with no known cause. ? Nausea or vomiting. ? Seizure.  Where to find more information: For more information, visit:  American Stroke Association: www.strokeassociation.org  National Stroke Association: www.stroke.org  Summary  You can prevent a stroke by eating  healthy, exercising, not smoking, limiting alcohol intake, and managing any medical conditions you may have.  Do not use any products that contain nicotine or tobacco, such as cigarettes and e-cigarettes. If you need help quitting, ask your health care provider. It may also be helpful to avoid exposure to secondhand smoke.  Remember BEFAST for warning signs of stroke. Get help right away if you or a loved one has any of these signs. This information is not intended to replace advice given to you by your health care provider. Make sure you discuss any questions you have with your  health care provider. Document Released: 09/19/2004 Document Revised: 09/17/2016 Document Reviewed: 09/17/2016 Elsevier Interactive Patient Education  2018 [employer]   Preventing Cerebrovascular Disease Arteries are blood vessels that carry blood that contains oxygen from the heart to all parts of the body. Cerebrovascular disease affects arteries that supply the brain. Any condition that blocks or disrupts blood flow to the brain can cause cerebrovascular disease. Brain cells that lose blood supply start to die within minutes (stroke). Stroke is the main danger of cerebrovascular disease. Atherosclerosis and high blood pressure are common causes of cerebrovascular disease. Atherosclerosis is narrowing and hardening of an artery that results when fat, cholesterol, calcium, or other substances (plaque) build up inside an artery. Plaque reduces blood flow through the artery. High blood pressure increases the risk of bleeding inside the brain. Making diet and lifestyle changes to prevent atherosclerosis and high blood pressure lowers your risk of cerebrovascular disease. What nutrition changes can be made?  Eat more fruits, vegetables, and whole grains.  Reduce how much saturated fat you eat. To do this, eat less red meat and fewer full-fat dairy products.  Eat healthy proteins instead of red meat. Healthy proteins  include: ? Fish. Eat fish that contains heart-healthy omega-3 fatty acids, twice a week. Examples include salmon, albacore tuna, mackerel, and herring. ? Chicken. ? Nuts. ? Low-fat or nonfat yogurt.  Avoid processed meats, like bacon and lunchmeat.  Avoid foods that contain: ? A lot of sugar, such as sweets and drinks with added sugar. ? A lot of salt (sodium). Avoid adding extra salt to your food, as told by your health care provider. ? Trans fats, such as margarine and baked goods. Trans fats may be listed as "partially hydrogenated oils on food labels.  Check food labels to see how much sodium, sugar, and trans fats are in foods.  Use vegetable oils that contain low amounts of saturated fat, such as olive oil or canola oil. What lifestyle changes can be made?  Drink alcohol in moderation. This means no more than 1 drink a day for nonpregnant women and 2 drinks a day for men. One drink equals 12 oz of beer, 5 oz of wine, or 1 oz of hard liquor.  If you are overweight, ask your health care provider to recommend a weight-loss plan for you. Losing 5-10 lb (2.2-4.5 kg) can reduce your risk of diabetes, atherosclerosis, and high blood pressure.  Exercise for 30?60 minutes on most days, or as much as told by your health care provider. ? Do moderate-intensity exercise, such as brisk walking, bicycling, and water aerobics. Ask your health care provider which activities are safe for you.  Do not use any products that contain nicotine or tobacco, such as cigarettes and e-cigarettes. If you need help quitting, ask your health care provider. Why are these changes important? Making these changes lowers your risk of many diseases that can cause cerebrovascular disease and stroke. Stroke is a leading cause of death and disability. Making these changes also improves your overall health and quality of life. What can I do to lower my risk? The following factors make you more likely to develop  cerebrovascular disease:  Being overweight.  Smoking.  Being physically inactive.  Eating a high-fat diet.  Having certain health conditions, such as: ? Diabetes. ? High blood pressure. ? Heart disease. ? Atherosclerosis. ? High cholesterol. ? Sickle cell disease.  Talk with your health care provider about your risk for cerebrovascular disease. Work with your health care  provider to control diseases that you have that may contribute to cerebrovascular disease. Your health care provider may prescribe medicines to help prevent major causes of cerebrovascular disease. Where to find more information: Learn more about preventing cerebrovascular disease from:  National Heart, Lung, and Blood Institute: ClassGossip.pl  Centers for Disease Control and Prevention: http://www.gonzalez-chase.net/  Summary  Cerebrovascular disease can lead to a stroke.  Atherosclerosis and high blood pressure are major causes of cerebrovascular disease.  Making diet and lifestyle changes can reduce your risk of cerebrovascular disease.  Work with your health care provider to get your risk factors under control to reduce your risk of cerebrovascular disease. This information is not intended to replace advice given to you by your health care provider. Make sure you discuss any questions you have with your health care provider. Document Released: 08/27/2015 Document Revised: 03/01/2016 Document Reviewed: 08/27/2015 Elsevier Interactive Patient Education  2018 [employer].

## 2018-06-10 NOTE — ED Provider Notes (Signed)
[facility] Provider Note   CSN: [id number] Arrival date & time: 06/09/18  1304     History   Chief Complaint Chief Complaint  Patient presents with  . Altered Mental Status    HPI Crystalann Korf is a 77 y.o. female.  HPI Patient is a 77 year old female presents the emergency department after complaints of sudden onset difficulty with speech at 12 noon today.  By the time she arrived to the emergency department her symptoms were subsiding.  She has a history of hypertension.  No prior history of stroke.  She denies chest pain shortness of breath.  No back pain.  Reports no headache at this time.  No use of anticoagulants.  She was in her normal state of health earlier today.  Hypertensive on arrival to the emergency department.  At this time patient denies weakness of her arms or legs.  Patient and family report no weakness of her arms or legs earlier today just difficulty with her speech.  Daughter who is at the bedside reports her speech is normalized   Past Medical History:  Diagnosis Date  . Hypertension     Patient Active Problem List   Diagnosis Date Noted  . TIA (transient ischemic attack) 06/09/2018  . Acute metabolic encephalopathy 06/09/2018  . Essential hypertension 06/09/2018    Past Surgical History:  Procedure Laterality Date  . ABDOMINAL HYSTERECTOMY    . CHOLECYSTECTOMY       OB History   None      Home Medications    Prior to Admission medications   Medication Sig Start Date End Date Taking? Authorizing Provider  aspirin EC 81 MG tablet Take by mouth.    [provider]  hydrochlorothiazide (HYDRODIURIL) 12.5 MG tablet Take by mouth.    [provider]  lisinopril (PRINIVIL,ZESTRIL) 40 MG tablet TAKE 1 TABLET(40 MG) BY MOUTH DAILY 05/14/18   [provider]  metoprolol tartrate (LOPRESSOR) 50 MG tablet Take by mouth. 12/25/17   [provider]  naproxen (NAPROSYN) 500 MG tablet Take 1 tablet  (500 mg total) by mouth 2 (two) times daily as needed. 05/28/18   Glynn Octave, MD    Family History Family History  Problem Relation Age of Onset  . Dementia Mother   . Heart disease Father   . Diabetes Mellitus II Brother   . Hypertension Brother     Social History Social History   Tobacco Use  . Smoking status: Former Games developer  . Smokeless tobacco: Never Used  Substance Use Topics  . Alcohol use: Not Currently  . Drug use: Not Currently     Allergies   Patient has no known allergies.   Review of Systems Review of Systems  All other systems reviewed and are negative.    Physical Exam Updated Vital Signs BP (!) 156/92 (BP Location: Right Arm)   Pulse 76   Temp 98.2 F (36.8 C) (Oral)   Resp 18   Ht 5\' 5"  (1.651 m)   Wt 94.3 kg   SpO2 98%   BMI 34.61 kg/m   Physical Exam  Constitutional: She is oriented to person, place, and time. She appears well-developed and well-nourished. No distress.  HENT:  Head: Normocephalic and atraumatic.  Eyes: Pupils are equal, round, and reactive to light. EOM are normal.  Neck: Normal range of motion.  Cardiovascular: Normal rate, regular rhythm and normal heart sounds.  Pulmonary/Chest: Effort normal and breath sounds normal.  Abdominal: Soft. She exhibits no  distension. There is no tenderness.  Musculoskeletal: Normal range of motion.  Neurological: She is alert and oriented to person, place, and time.  5/5 strength in major muscle groups of  bilateral upper and lower extremities. Speech normal. No facial asymetry.  Normal finger-to-nose bilaterally.  Skin: Skin is warm and dry.  Psychiatric: She has a normal mood and affect. Judgment normal.  Nursing note and vitals reviewed.    ED Treatments / Results  Labs (all labs ordered are listed, but only abnormal results are displayed) Labs Reviewed  CBC - Abnormal; Notable for the following components:      Result Value   RBC 5.23 (*)    HCT 47.9 (*)    All other  components within normal limits  COMPREHENSIVE METABOLIC PANEL - Abnormal; Notable for the following components:   Glucose, Bld 103 (*)    All other components within normal limits  URINALYSIS, ROUTINE W REFLEX MICROSCOPIC - Abnormal; Notable for the following components:   Protein, ur 100 (*)    All other components within normal limits  URINALYSIS, MICROSCOPIC (REFLEX) - Abnormal; Notable for the following components:   Bacteria, UA FEW (*)    All other components within normal limits  HEMOGLOBIN A1C - Abnormal; Notable for the following components:   Hgb A1c MFr Bld 6.1 (*)    All other components within normal limits  LIPID PANEL - Abnormal; Notable for the following components:   HDL 36 (*)    All other components within normal limits  ETHANOL  PROTIME-INR  APTT  DIFFERENTIAL  TROPONIN I  RAPID URINE DRUG SCREEN, HOSP PERFORMED  CBG MONITORING, ED    EKG EKG Interpretation  Date/Time:  Tuesday June 09 2018 13:30:01 EDT Ventricular Rate:  77 PR Interval:    QRS Duration: 85 QT Interval:  419 QTC Calculation: 475 R Axis:   10 Text Interpretation:  Sinus rhythm Multiple ventricular premature complexes Probable left atrial enlargement Abnormal R-wave progression, early transition Abnormal inferior Q waves No old tracing to compare Confirmed by Azalia Bilis ([id number]) on 06/09/2018 3:10:58 PM   Radiology Dg Chest 2 View  Result Date: 06/09/2018 CLINICAL DATA:  Increased confusion EXAM: CHEST - 2 VIEW COMPARISON:  None. FINDINGS: Cardiac shadow is at the upper limits of normal in size. The lungs are well aerated bilaterally on the frontal film but somewhat hypoaerated on the lateral suggesting basilar atelectasis. This is felt to be related to the poor inspiratory effort. No bony abnormality is seen. Degenerative change of thoracic spine is noted. IMPRESSION: Poor inspiratory effort on the lateral projection. No acute abnormality noted. Electronically Signed   By: Alcide Clever M.D.   On: 06/09/2018 16:11   Ct Head Wo Contrast  Result Date: 06/09/2018 CLINICAL DATA:  Focal neuro deficit. Altered mental status confusion EXAM: CT HEAD WITHOUT CONTRAST TECHNIQUE: Contiguous axial images were obtained from the base of the skull through the vertex without intravenous contrast. COMPARISON:  CT head 12/25/2017 FINDINGS: Brain: Progressive moderate atrophy. Progression of diffuse white matter hypodensity. No mass-effect or midline shift. Negative for acute infarct, hemorrhage, or mass. Sella remains markedly enlarged and filled with CSF. No pituitary tissue identified. Findings consistent with empty sella. Vascular: Negative for hyperdense vessel Skull: Negative Sinuses/Orbits: Paranasal sinuses clear.  Normal orbit. Other: None IMPRESSION: Since 01/14/2018, there has been progression of atrophy and diffuse white matter disease. No acute abnormality. Consider MRI head without and with contrast for further evaluation. Electronically Signed   By: Leonette Most  Chestine Spore M.D.   On: 06/09/2018 14:01     Procedures Procedures (including critical care time)  Medications Ordered in ED Medications  0.9 %  sodium chloride infusion ( Intravenous Rate/Dose Verify 06/10/18 0400)  acetaminophen (TYLENOL) tablet 650 mg (has no administration in time range)    Or  acetaminophen (TYLENOL) solution 650 mg (has no administration in time range)    Or  acetaminophen (TYLENOL) suppository 650 mg (has no administration in time range)  senna-docusate (Senokot-S) tablet 1 tablet (has no administration in time range)  enoxaparin (LOVENOX) injection 40 mg (40 mg Subcutaneous Given 06/09/18 2309)  aspirin suppository 300 mg (300 mg Rectal Not Given 06/09/18 2312)    Or  aspirin tablet 325 mg ( Oral See Alternative 06/09/18 2312)  atorvastatin (LIPITOR) tablet 80 mg (80 mg Oral Given 06/09/18 2311)  ondansetron (ZOFRAN) injection 4 mg (has no administration in time range)  hydrALAZINE (APRESOLINE)  injection 5 mg (has no administration in time range)  zolpidem (AMBIEN) tablet 5 mg (5 mg Oral Given 06/09/18 2310)  LORazepam (ATIVAN) injection 1 mg (has no administration in time range)   stroke: mapping our early stages of recovery book ( Does not apply Given 06/09/18 2312)     Initial Impression / Assessment and Plan / ED Course  I have reviewed the triage vital signs and the nursing notes.  Pertinent labs & imaging results that were available during my care of the patient were reviewed by me and considered in my medical decision making (see chart for details).     Resolution of symptoms on arrival to the emergency department.  At time my evaluation she has normal neurologic exam.  Suspect TIA.  Head CT without acute abnormality however she has had advanced atrophy and white matter changes since her prior CT.  She will be admitted to the hospital.  Stroke team consultation.  Hospitalist admission.  Patient and family updated.  Final Clinical Impressions(s) / ED Diagnoses   Final diagnoses:  TIA (transient ischemic attack)    ED Discharge Orders    None       Azalia Bilis, MD 06/10/18 [phone]

## 2018-06-10 NOTE — Discharge Summary (Addendum)
Discharge Summary  Emily Barrett OZD:[id number] DOB: [birth year]  PCP: Patient, No Pcp Per  Admit date: 06/09/2018 Discharge date: 06/10/2018  Time spent: 35 minutes  DISCHARGE DELAYED: Patient reports difficulty word findings around 5:55 pm tonight. Per family change in speech with slurriness. Ordered stat MRI brain and PT to assess in the am. Continue close monitoring on telemetry overnight. DC discharge order due to change in status.  Recommendations for Outpatient Follow-up:  1. Follow-up with neurology outpatient 2. Follow with PCP 3. Take your medications as prescribed 4. NOT ALLOWED TO DRIVE FOR AT LEAST 6 MONTHS OR UNTIL CLEARED BY NEUROLOGY.  Discharge Diagnoses:  Active Hospital Problems   Diagnosis Date Noted  . TIA (transient ischemic attack) 06/09/2018  . Acute metabolic encephalopathy 06/09/2018  . Essential hypertension 06/09/2018    Resolved Hospital Problems  No resolved problems to display.    Discharge Condition: Stable  Diet recommendation: Resume previous diet  Vitals:   06/10/18 1132 06/10/18 1535  BP: (!) 146/90 (!) 184/93  Pulse: 73 73  Resp: 18 18  Temp: 98.8 F (37.1 C) 98.5 F (36.9 C)  SpO2: 98% 99%    History of present illness:  Emily Barrett is a 77 y.o. female with medical history significant of hypertension, HOH, who presents with altered mental status and difficulty speaking.  Per her daughter, patient was last known normal at about 12:00, but she suddenly became confused at about 12:15.  She had difficult speaking out.  She did not have vision loss, facial droop, unilateral weakness, numbness or tingling his extremities.  Patient has chronic bilateral hearing loss, which has not changed.  Per her daughter, symptoms lasted for about 30 minutes, then resolved spontaneously.  Patient denies any chest pain, shortness breath, cough, fever or chills.  She had one loose stool bowel movement earlier, currently no nausea vomiting, diarrhea or  abdominal pain.  Denies symptoms of UTI. When I saw pt on the floor, she is alert, oriented x3.  No unilateral weakness, facial droop, slurred speech.  No difficulty speaking. CT head is negative for acute intracranial abnormalities.  Patient is placed on telemetry bed of observation.  Neurology was consulted by EDP (Neurology (accepting MD did not Neurologist name).   06/10/2018: Patient seen and examined with daughters at bedside.  Per daughter she is back to her baseline.  MRI brain unremarkable for any acute intracranial findings.  Carotid duplex ultrasound unremarkable, EEG unremarkable.  On the day of discharge, the patient was hemodynamically stable.  She will need to follow-up with neurology and her PCP post hospitalization.  Patient not allowed to drive until cleared by neurology.  Hospital Course:  Principal Problem:   TIA (transient ischemic attack) Active Problems:   Acute metabolic encephalopathy   Essential hypertension   TIA (transient ischemic attack): Patient had transient confusion and difficulty speaking, which is resolved in 30 minutes.  Concerning for TIA.  CT head showed atrophy and diffuse white matter disease, but no acute intracranial abnormalities.  Neurology was consulted by EDP (Neurology (accepting MD did not Neurologist name). - Carotid dopplers -unremarkable - Continue Lipitor 80 mg daily and ASA - fasting lipid panel LDL 87 and HbA1c 6.1 - 2D transthoracic echocardiography unremarkable  Addendum: MRI negative for stroke. Dr. Amada Jupiter suspect patient may have complex partial seizure.  He ordered EEG. -EEG was unremarkable  Acute metabolic encephalopathy: due to TIA, resolved  Essential hypertension: -Resume antihypertensive medications   Procedures: None Consultations:  Neurology  Discharge Exam: BP Emily Barrett)  184/93 (BP Location: Left Arm) Comment: RN notified  Pulse 73   Temp 98.5 F (36.9 C) (Oral)   Resp 18   Ht 5\' 5"  (1.651 m)   Wt 94.3  kg   SpO2 99%   BMI 34.61 kg/m  . General: 77 y.o. year-old female well developed well nourished in no acute distress.  Alert and interactive.  Very hard of hearing. . Cardiovascular: Regular rate and rhythm with no rubs or gallops.  No thyromegaly or JVD noted.   Emily Barrett Respiratory: Clear to auscultation with no wheezes or rales. Good inspiratory effort. . Abdomen: Soft nontender nondistended with normal bowel sounds x4 quadrants. . Musculoskeletal: No lower extremity edema. 2/4 pulses in all 4 extremities.  Moves all 4 extremities without weakness. Emily Barrett Psychiatry: Mood is appropriate for condition and setting  Discharge Instructions You were cared for by a hospitalist during your hospital stay. If you have any questions about your discharge medications or the care you received while you were in the hospital after you are discharged, you can call the unit and asked to speak with the hospitalist on call if the hospitalist that took care of you is not available. Once you are discharged, your primary care physician will handle any further medical issues. Please note that NO REFILLS for any discharge medications will be authorized once you are discharged, as it is imperative that you return to your primary care physician (or establish a relationship with a primary care physician if you do not have one) for your aftercare needs so that they can reassess your need for medications and monitor your lab values.   Allergies as of 06/10/2018      Reactions   Penicillin G Itching      Medication List    STOP taking these medications   naproxen 500 MG tablet Commonly known as:  NAPROSYN     TAKE these medications   aspirin EC 81 MG tablet Take by mouth.   atorvastatin 80 MG tablet Commonly known as:  LIPITOR Take 1 tablet (80 mg total) by mouth daily at 6 PM.   azelastine 0.1 % nasal spray Commonly known as:  ASTELIN Place 1 spray into the nose 2 (two) times daily.   CALCIUM 600 600 MG Tabs  tablet Generic drug:  calcium carbonate Take 600 mg by mouth 2 (two) times daily.   fexofenadine 180 MG tablet Commonly known as:  ALLEGRA Take 180 mg by mouth daily as needed for allergies or rhinitis.   hydrochlorothiazide 12.5 MG tablet Commonly known as:  HYDRODIURIL Take by mouth.   lisinopril 40 MG tablet Commonly known as:  PRINIVIL,ZESTRIL Take 40 mg by mouth daily.   metoprolol tartrate 50 MG tablet Commonly known as:  LOPRESSOR Take 50 mg by mouth 2 (two) times daily.   PRESERVISION AREDS 2 Caps Take 1 capsule by mouth 2 (two) times daily.   VITAMIN D-1000 MAX ST 1000 units tablet Generic drug:  Cholecalciferol Take 1,000 Units by mouth daily.      Allergies  Allergen Reactions  . Penicillin G Itching   Follow-up Information    [facility]. Call in 1 day(s).   Specialty:  Neurology Why:  Please call for a post hospital follow-up appointment Contact information: [address] [place] [address] [phone]       [facility]. Call in 1 day(s).   Why:  Please call for a post hospital follow-up appointment Contact information: [address] [place] [address] [phone]           The results of significant diagnostics from this hospitalization (including imaging, microbiology, ancillary and laboratory) are listed below for reference.    Significant Diagnostic Studies: Dg Chest 2 View  Result Date: 06/09/2018 CLINICAL DATA:  Increased confusion EXAM: CHEST - 2 VIEW COMPARISON:  None. FINDINGS: Cardiac shadow is at the upper limits of normal in size. The lungs are well aerated bilaterally on the frontal film but somewhat hypoaerated on the lateral suggesting basilar atelectasis. This is felt to be related to the poor inspiratory effort. No bony abnormality is seen. Degenerative change of thoracic spine is noted. IMPRESSION: Poor inspiratory effort on  the lateral projection. No acute abnormality noted. Electronically Signed   By: Alcide Clever M.D.   On: 06/09/2018 16:11   Ct Head Barrett Contrast  Result Date: 06/09/2018 CLINICAL DATA:  Focal neuro deficit. Altered mental status confusion EXAM: CT HEAD WITHOUT CONTRAST TECHNIQUE: Contiguous axial images were obtained from the base of the skull through the vertex without intravenous contrast. COMPARISON:  CT head 12/25/2017 FINDINGS: Brain: Progressive moderate atrophy. Progression of diffuse white matter hypodensity. No mass-effect or midline shift. Negative for acute infarct, hemorrhage, or mass. Sella remains markedly enlarged and filled with CSF. No pituitary tissue identified. Findings consistent with empty sella. Vascular: Negative for hyperdense vessel Skull: Negative Sinuses/Orbits: Paranasal sinuses clear.  Normal orbit. Other: None IMPRESSION: Since 01/14/2018, there has been progression of atrophy and diffuse white matter disease. No acute abnormality. Consider MRI head without and with contrast for further evaluation. Electronically Signed   By: Marlan Palau M.D.   On: 06/09/2018 14:01   Ct Knee Right Barrett Contrast  Result Date: 05/28/2018 CLINICAL DATA:  Knee trauma, tenderness or effusion or can't walk, initial exam. Right knee injury 4 days ago. EXAM: CT OF THE RIGHT KNEE WITHOUT CONTRAST TECHNIQUE: Multidetector CT imaging of the RIGHT knee was performed according to the standard protocol. Multiplanar CT image reconstructions were also generated. COMPARISON:  Radiographs 3 hours prior. FINDINGS: Bones/Joint/Cartilage No fracture or dislocation. Advanced tricompartmental spurring with spurring of the tibial spines. Prominent medial tibiofemoral and patellofemoral joint space narrowing. Intra-articular bodies anteriorly in the joint. Subchondral cysts at the intercondylar notch. Chronic bony fragmentation about the proximal tibia/fibular articulation. Small joint effusion without  lipohemarthrosis. Ligaments Suboptimally assessed by CT. Muscles and Tendons No intramuscular hematoma. Patellar tendon is intact. Mild thickening of the quadriceps tendon which is likely chronic. Soft tissues Soft tissue edema anteriorly.  No confluent hematoma. IMPRESSION: 1. 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 Intra-articular body anteriorly. 2. Small joint effusion. Electronically Signed   By: Narda Rutherford M.D.   On: 05/28/2018 01:25   Emily Barrett Contrast  Result Date: 06/09/2018 CLINICAL DATA:  77 y/o F; altered mental status and difficulty speaking. EXAM: MRI HEAD WITHOUT CONTRAST MRA HEAD WITHOUT CONTRAST TECHNIQUE: Multiplanar, multiecho pulse sequences of the brain and surrounding structures were obtained without intravenous contrast. Angiographic images of the head were obtained using MRA technique without contrast. COMPARISON:  06/09/2018 CT head. FINDINGS: MRI HEAD FINDINGS Brain: No acute infarction, hemorrhage, hydrocephalus, extra-axial collection or mass lesion. Empty sella turcica with expansile CSF signal cystic structure containing a thin membrane extending into the clivus as seen on prior studies, possibly a arachnoid cyst or meningocele. Patchy confluent nonspecific T2 FLAIR hyperintensities in subcortical and periventricular white matter as well as the pons are compatible with advanced chronic microvascular ischemic  changes for age. Moderate volume loss of the brain. Vascular: As below. Skull and upper cervical spine: Normal marrow signal. Sinuses/Orbits: Bilateral mastoid air cell opacification. No abnormal signal of the paranasal sinuses. Bilateral intra-ocular lens replacement. Other: None. MRA HEAD FINDINGS Internal carotid arteries:  Patent. Anterior cerebral arteries: Patent. Bilateral A2 irregularity multiple segments of high-grade stenosis. Middle cerebral arteries: Patent. Left M2 inferior division proximal segment of high-grade stenosis.  Anterior communicating artery: Patent. Posterior communicating arteries:  Patent. Posterior cerebral arteries: Patent. Segments of mild distal P2/P3 stenosis bilaterally. Basilar artery:  Patent.  Fenestrated lower basilar artery. Vertebral arteries:  Patent. Mild motion artifact may accentuate areas of stenosis as described above. No large vessel occlusion or aneurysm identified. IMPRESSION: MRI head: 1. No acute intracranial abnormality identified. 2. Advanced chronic microvascular ischemic changes and moderate volume loss of the brain for age. 3. 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 MRA head: 1. Motion artifact. 2. No large vessel occlusion or aneurysm. 3. Multiple segments of stenosis greatest in the bilateral A2 and left proximal M2 inferior division. Electronically Signed   By: Mitzi Hansen M.D.   On: 06/09/2018 22:33   Dg Knee Complete 4 Views Right  Result Date: 05/27/2018 CLINICAL DATA:  Pain. EXAM: RIGHT KNEE - COMPLETE 4+ VIEW COMPARISON:  None. FINDINGS: No significant joint effusion. Moderate to severe tricompartmental degenerative changes. No acute fractures noted. IMPRESSION: Moderate to severe tricompartmental degenerative changes. Electronically Signed   By: Gerome Sam III M.D   On: 05/27/2018 21:36   Emily Barrett Head Barrett Contrast  Result Date: 06/09/2018 CLINICAL DATA:  77 y/o F; altered mental status and difficulty speaking. EXAM: MRI HEAD WITHOUT CONTRAST MRA HEAD WITHOUT CONTRAST TECHNIQUE: Multiplanar, multiecho pulse sequences of the brain and surrounding structures were obtained without intravenous contrast. Angiographic images of the head were obtained using MRA technique without contrast. COMPARISON:  06/09/2018 CT head. FINDINGS: MRI HEAD FINDINGS Brain: No acute infarction, hemorrhage, hydrocephalus, extra-axial collection or mass lesion. Empty sella turcica with expansile CSF signal cystic structure  containing a thin membrane extending into the clivus as seen on prior studies, possibly a arachnoid cyst or meningocele. Patchy confluent nonspecific T2 FLAIR hyperintensities in subcortical and periventricular white matter as well as the pons are compatible with advanced chronic microvascular ischemic changes for age. Moderate volume loss of the brain. Vascular: As below. Skull and upper cervical spine: Normal marrow signal. Sinuses/Orbits: Bilateral mastoid air cell opacification. No abnormal signal of the paranasal sinuses. Bilateral intra-ocular lens replacement. Other: None. MRA HEAD FINDINGS Internal carotid arteries:  Patent. Anterior cerebral arteries: Patent. Bilateral A2 irregularity multiple segments of high-grade stenosis. Middle cerebral arteries: Patent. Left M2 inferior division proximal segment of high-grade stenosis. Anterior communicating artery: Patent. Posterior communicating arteries:  Patent. Posterior cerebral arteries: Patent. Segments of mild distal P2/P3 stenosis bilaterally. Basilar artery:  Patent.  Fenestrated lower basilar artery. Vertebral arteries:  Patent. Mild motion artifact may accentuate areas of stenosis as described above. No large vessel occlusion or aneurysm identified. IMPRESSION: MRI head: 1. No acute intracranial abnormality identified. 2. Advanced chronic microvascular ischemic changes and moderate volume loss of the brain for age. 3. 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 MRA head: 1. Motion artifact. 2. No large vessel occlusion or aneurysm. 3. Multiple segments of stenosis greatest in the bilateral A2 and left proximal M2 inferior division. Electronically Signed   By: Micah Noel  [place] M.D.   On: 06/09/2018 22:33    Microbiology: No results found for this or any previous visit (from the past 240 hour(s)).   Labs: Basic Metabolic Panel: Recent Labs  Lab 06/09/18 1339  NA 141  K 4.2  CL  105  CO2 26  GLUCOSE 103*  BUN 19  CREATININE 0.61  CALCIUM 9.8   Liver Function Tests: Recent Labs  Lab 06/09/18 1339  AST 21  ALT 17  ALKPHOS 60  BILITOT 0.8  PROT 7.8  ALBUMIN 4.0   No results for input(s): LIPASE, AMYLASE in the last 168 hours. No results for input(s): AMMONIA in the last 168 hours. CBC: Recent Labs  Lab 06/09/18 1339  WBC 7.3  NEUTROABS 2.6  HGB 14.9  HCT 47.9*  MCV 91.6  PLT 284   Cardiac Enzymes: Recent Labs  Lab 06/09/18 1339  TROPONINI <0.03   BNP: BNP (last 3 results) No results for input(s): BNP in the last 8760 hours.  ProBNP (last 3 results) No results for input(s): PROBNP in the last 8760 hours.  CBG: Recent Labs  Lab 06/09/18 1328  GLUCAP 99       Signed:  Darlin Drop, MD [facility] 06/10/2018, 5:05 PM

## 2018-06-10 NOTE — Progress Notes (Signed)
Patient became tearful, 15 seconds unable to respond to orientation questions. Increased loss of fluency and word-finding difficulty noted, unsteady gait. MD notified.

## 2018-06-11 DIAGNOSIS — I10 Essential (primary) hypertension: Secondary | ICD-10-CM | POA: Diagnosis not present

## 2018-06-11 DIAGNOSIS — G459 Transient cerebral ischemic attack, unspecified: Secondary | ICD-10-CM | POA: Diagnosis not present

## 2018-06-11 DIAGNOSIS — R569 Unspecified convulsions: Secondary | ICD-10-CM

## 2018-06-11 DIAGNOSIS — G9341 Metabolic encephalopathy: Secondary | ICD-10-CM | POA: Diagnosis not present

## 2018-06-11 MED ORDER — LEVETIRACETAM IN NACL 1000 MG/100ML IV SOLN
1000.0000 mg | INTRAVENOUS | Status: AC
  Administered 2018-06-11: 1000 mg via INTRAVENOUS
  Filled 2018-06-11: qty 100

## 2018-06-11 MED ORDER — ATORVASTATIN CALCIUM 40 MG PO TABS: 40.0000 mg | ORAL_TABLET | Freq: Every day | ORAL | 0 refills | Status: AC

## 2018-06-11 MED ORDER — CLOPIDOGREL BISULFATE 75 MG PO TABS
75.0000 mg | ORAL_TABLET | Freq: Every day | ORAL | Status: DC
Start: 2018-06-11 — End: 2018-06-11
  Administered 2018-06-11: 75 mg via ORAL
  Filled 2018-06-11: qty 1

## 2018-06-11 MED ORDER — ASPIRIN 81 MG PO TBEC: 81.0000 mg | DELAYED_RELEASE_TABLET | Freq: Every day | ORAL | 0 refills | Status: AC

## 2018-06-11 MED ORDER — LEVETIRACETAM 500 MG PO TABS: 500.0000 mg | ORAL_TABLET | Freq: Two times a day (BID) | ORAL | 0 refills | Status: AC

## 2018-06-11 MED ORDER — ASPIRIN EC 81 MG PO TBEC: 81.0000 mg | DELAYED_RELEASE_TABLET | Freq: Every day | ORAL | Status: DC

## 2018-06-11 MED ORDER — CLOPIDOGREL BISULFATE 75 MG PO TABS: 75.0000 mg | ORAL_TABLET | Freq: Every day | ORAL | 0 refills | Status: AC

## 2018-06-11 MED ORDER — LEVETIRACETAM 500 MG PO TABS: 500.0000 mg | ORAL_TABLET | Freq: Two times a day (BID) | ORAL | Status: DC

## 2018-06-11 NOTE — Care Management Note (Signed)
Case Management Note  Patient Details  Name: Ruthene Methvin MRN: [id number] Date of Birth: 12-18-1940  Subjective/Objective:   Pt in with a TIA. She is from home alone but has a lot of family support.  Pt has: lift commode, walker, cane and shower chair at home. Pt denies issues with transportation or with obtaining her medications.                 Action/Plan: Pt discharging home with self care. No f/u per PT. Family able to provide some supervision at home and transportation to home.   Expected Discharge Date:  06/11/18               Expected Discharge Plan:  Home/Self Care  In-House Referral:     Discharge planning Services     Post Acute Care Choice:    Choice offered to:     DME Arranged:    DME Agency:     HH Arranged:    HH Agency:     Status of Service:  Completed, signed off  If discussed at [employer] of Stay Meetings, dates discussed:    Additional Comments:  Kermit Balo, RN 06/11/2018, 2:00 PM

## 2018-06-11 NOTE — Evaluation (Signed)
Physical Therapy Evaluation Patient Details Name: Emily Barrett MRN: [id number] DOB: Nov 27, 1940 Today's Date: 06/11/2018   History of Present Illness  Patient is a 77 y/o female presenting to the ED on 06/09/18 with primary complaints of AMS and difficulty speaking. Chest x-ray negative.  CT head showed atrophy and diffuse white matter disease, but no acute intracranial abnormalities. MRI negative for stroke. Admitted for TIA. Past medical history significant for HTN, HOH.     Clinical Impression  Emily Barrett is a very pleasant 77 y/o female admitted with the above listed diagnosis. Patients family present and supportive throughout session. Patient today requiring general min guard throughout session with verbal cueing for safety with assistive device. No LOB or overt instability with mobility with PT not anticipating PT needs at discharge. PT to continue to follow acutely to maximize safe functional mobility prior to return home.     Follow Up Recommendations No PT follow up    Equipment Recommendations  None recommended by PT    Recommendations for Other Services       Precautions / Restrictions Precautions Precautions: Fall Restrictions Weight Bearing Restrictions: No      Mobility  Bed Mobility Overal bed mobility: Modified Independent                Transfers Overall transfer level: Needs assistance Equipment used: Rolling walker (2 wheeled) Transfers: Sit to/from Stand Sit to Stand: Supervision         General transfer comment: for safety and immediate standing balance  Ambulation/Gait Ambulation/Gait assistance: Min guard Gait Distance (Feet): 200 Feet Assistive device: Rolling walker (2 wheeled) Gait Pattern/deviations: Step-through pattern;Decreased stride length;Trunk flexed Gait velocity: decreased   General Gait Details: preferred downward gaze; verbal cueing to keep walking as patient stops multiple times; easily distracted; VC to remain close to  RW  Stairs Stairs: Yes Stairs assistance: Min guard Stair Management: Two rails;Alternating pattern Number of Stairs: 3(2 sets)    Wheelchair Mobility    Modified Rankin (Stroke Patients Only) Modified Rankin (Stroke Patients Only) Pre-Morbid Rankin Score: Slight disability Modified Rankin: Slight disability     Balance Overall balance assessment: Mild deficits observed, not formally tested                                           Pertinent Vitals/Pain Pain Assessment: No/denies pain    Home Living Family/patient expects to be discharged to:: Private residence Living Arrangements: Alone(senior community) Available Help at Discharge: Family;Available PRN/intermittently Type of Home: House Home Access: Stairs to enter Entrance Stairs-Rails: Right Entrance Stairs-Number of Steps: 3 Home Layout: One level Home Equipment: Walker - 2 wheels;Cane - quad;Cane - single point      Prior Function Level of Independence: Needs assistance   Gait / Transfers Assistance Needed: ambulating with RW vs QC  ADL's / Homemaking Assistance Needed: children assist with driving, groceries, house cleaning        Hand Dominance        Extremity/Trunk Assessment        Lower Extremity Assessment Lower Extremity Assessment: Overall WFL for tasks assessed    Cervical / Trunk Assessment Cervical / Trunk Assessment: Kyphotic  Communication   Communication: HOH  Cognition Arousal/Alertness: Awake/alert Behavior During Therapy: WFL for tasks assessed/performed Overall Cognitive Status: Within Functional Limits for tasks assessed  General Comments General comments (skin integrity, edema, etc.): family present throughout    Exercises     Assessment/Plan    PT Assessment Patient needs continued PT services  PT Problem List Decreased strength;Decreased activity tolerance;Decreased balance;Decreased  mobility;Decreased knowledge of use of DME;Decreased safety awareness       PT Treatment Interventions DME instruction;Gait training;Stair training;Functional mobility training;Therapeutic activities;Therapeutic exercise;Balance training;Neuromuscular re-education;Patient/family education    PT Goals (Current goals can be found in the Care Plan section)  Acute Rehab PT Goals Patient Stated Goal: return home today PT Goal Formulation: With patient Time For Goal Achievement: 06/18/18 Potential to Achieve Goals: Good    Frequency Min 4X/week   Barriers to discharge        Co-evaluation               AM-PAC PT "6 Clicks" Daily Activity  Outcome Measure Difficulty turning over in bed (including adjusting bedclothes, sheets and blankets)?: A Little Difficulty moving from lying on back to sitting on the side of the bed? : A Little Difficulty sitting down on and standing up from a chair with arms (e.g., wheelchair, bedside commode, etc,.)?: Unable Help needed moving to and from a bed to chair (including a wheelchair)?: A Little Help needed walking in hospital room?: A Little Help needed climbing 3-5 steps with a railing? : A Little 6 Click Score: 16    End of Session Equipment Utilized During Treatment: Gait belt Activity Tolerance: Patient tolerated treatment well Patient left: in bed;with call bell/phone within reach;with family/visitor present;with nursing/sitter in room Nurse Communication: Mobility status PT Visit Diagnosis: Unsteadiness on feet (R26.81);Other abnormalities of gait and mobility (R26.89);Muscle weakness (generalized) (M62.81)    Time: 1610-9604 PT Time Calculation (min) (ACUTE ONLY): 26 min   Charges:   PT Evaluation $PT Eval Moderate Complexity: 1 Mod         Kipp Laurence, PT, DPT Supplemental Physical Therapist 06/11/18 10:57 AM Pager: [phone] Office: [phone]

## 2018-06-11 NOTE — Discharge Summary (Signed)
Discharge Summary  Emily Barrett:[id number] DOB: 1941-08-13  PCP: Patient, No Pcp Per  Admit date: 06/09/2018 Discharge date: 06/11/2018  Time spent: 35 minutes  DISCHARGE DELAYED on 06/10/18: Patient reports difficulty word findings around 5:55 pm tonight. Per family change in speech with slurriness. Ordered stat MRI brain and PT to assess in the am. Continue close monitoring on telemetry overnight. DC discharge order due to change in status.  Recommendations for Outpatient Follow-up:  1. Follow-up with neurology outpatient 2. Follow with PCP 3. Take your medications as prescribed 4. NOT ALLOWED TO DRIVE FOR AT LEAST 6 MONTHS OR UNTIL CLEARED BY NEUROLOGY.  Discharge Diagnoses:  Active Hospital Problems   Diagnosis Date Noted  . TIA (transient ischemic attack) 06/09/2018  . Acute metabolic encephalopathy 06/09/2018  . Essential hypertension 06/09/2018    Resolved Hospital Problems  No resolved problems to display.    Discharge Condition: Stable  Diet recommendation: Resume previous diet  Vitals:   06/11/18 0339 06/11/18 1010  BP: 103/89 (!) 181/82  Pulse: 63 81  Resp: 18 18  Temp: 98.1 F (36.7 C) 99 F (37.2 C)  SpO2: 99% 99%    History of present illness:  Emily Barrett is a 77 y.o. female with medical history significant of hypertension, HOH, who presents with altered mental status and difficulty speaking.  Per her daughter, patient was last known normal at about 12:00, but she suddenly became confused at about 12:15.  She had difficult speaking out.  She did not have vision loss, facial droop, unilateral weakness, numbness or tingling his extremities.  Patient has chronic bilateral hearing loss, which has not changed.  Per her daughter, symptoms lasted for about 30 minutes, then resolved spontaneously.  Patient denies any chest pain, shortness breath, cough, fever or chills.  She had one loose stool bowel movement earlier, currently no nausea vomiting,  diarrhea or abdominal pain.  Denies symptoms of UTI. When I saw pt on the floor, she is alert, oriented x3.  No unilateral weakness, facial droop, slurred speech.  No difficulty speaking. CT head is negative for acute intracranial abnormalities.  Patient is placed on telemetry bed of observation.  Neurology was consulted by EDP (Neurology (accepting MD did not Neurologist name).   06/10/2018: Patient seen and examined with daughters at bedside.  Per daughter she is back to her baseline.  MRI brain unremarkable for any acute intracranial findings.  Carotid duplex ultrasound unremarkable, EEG unremarkable.  06/11/18: Patient seen and examined with her daughters at bedside.  She is back to her baseline.  No aphasia noted.  Repeated MRI done last night 06/10/2018 unremarkable for any acute intracranial findings.  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  Plavix added to aspirin 81 mg daily.  Antiepileptic drug also added Keppra loading dose 1 g given IV once.  To continue Keppra 500 mg p.o. twice daily.  Patient is to follow-up with outpatient neurology.  On the day of discharge, the patient was hemodynamically stable.  She will need to follow-up with neurology and her PCP post hospitalization.  Patient not allowed to drive until cleared by neurology.  Hospital Course:  Principal Problem:   TIA (transient ischemic attack) Active Problems:   Acute metabolic encephalopathy   Essential hypertension   TIA (transient ischemic attack): Patient had transient confusion and difficulty speaking, which is resolved in 30 minutes.  Concerning for TIA.  CT head showed atrophy and diffuse white matter disease, but no acute intracranial abnormalities.  Neurology was consulted by EDP (Neurology (accepting MD did not Neurologist name). - Carotid dopplers -unremarkable - Continue Lipitor 80 mg daily and ASA - fasting lipid panel LDL 87 and HbA1c 6.1 - 2D transthoracic echocardiography  unremarkable  Addendum: MRI negative for stroke. Dr. Amada Jupiter suspect patient may have complex partial seizure.  He ordered EEG. -EEG was unremarkable  Acute metabolic encephalopathy: due to TIA, resolved  Essential hypertension: -Resume antihypertensive medications   Procedures: None Consultations:  Neurology  Discharge Exam: BP (!) 181/82 (BP Location: Left Arm)   Pulse 81   Temp 99 F (37.2 C) (Oral)   Resp 18   Ht 5\' 5"  (1.651 m)   Wt 94.3 kg   SpO2 99%   BMI 34.61 kg/m  . General: 77 y.o. year-old female well developed well nourished in no acute distress.  Alert and interactive.  Very hard of hearing. . Cardiovascular: Regular rate and rhythm with no rubs or gallops.  No thyromegaly or JVD noted.   Marland Kitchen Respiratory: Clear to auscultation with no wheezes or rales. Good inspiratory effort. . Abdomen: Soft nontender nondistended with normal bowel sounds x4 quadrants. . Musculoskeletal: No lower extremity edema. 2/4 pulses in all 4 extremities.  Moves all 4 extremities without weakness. Marland Kitchen Psychiatry: Mood is appropriate for condition and setting  Discharge Instructions You were cared for by a hospitalist during your hospital stay. If you have any questions about your discharge medications or the care you received while you were in the hospital after you are discharged, you can call the unit and asked to speak with the hospitalist on call if the hospitalist that took care of you is not available. Once you are discharged, your primary care physician will handle any further medical issues. Please note that NO REFILLS for any discharge medications will be authorized once you are discharged, as it is imperative that you return to your primary care physician (or establish a relationship with a primary care physician if you do not have one) for your aftercare needs so that they can reassess your need for medications and monitor your lab values.   Allergies as of 06/11/2018       Reactions   Penicillin G Itching      Medication List    STOP taking these medications   naproxen 500 MG tablet Commonly known as:  NAPROSYN     TAKE these medications   aspirin 81 MG EC tablet Take 1 tablet (81 mg total) by mouth daily. What changed:    how much to take  when to take this   atorvastatin 40 MG tablet Commonly known as:  LIPITOR Take 1 tablet (40 mg total) by mouth daily at 6 PM.   azelastine 0.1 % nasal spray Commonly known as:  ASTELIN Place 1 spray into the nose 2 (two) times daily.   CALCIUM 600 600 MG Tabs tablet Generic drug:  calcium carbonate Take 600 mg by mouth 2 (two) times daily.   clopidogrel 75 MG tablet Commonly known as:  PLAVIX Take 1 tablet (75 mg total) by mouth daily.   fexofenadine 180 MG tablet Commonly known as:  ALLEGRA Take 180 mg by mouth daily as needed for allergies or rhinitis.   hydrochlorothiazide 12.5 MG tablet Commonly known as:  HYDRODIURIL Take by mouth.   levETIRAcetam 500 MG tablet Commonly known as:  KEPPRA Take 1 tablet (500 mg total) by mouth 2 (two) times daily.   lisinopril 40 MG tablet Commonly known as:  PRINIVIL,ZESTRIL  Take 40 mg by mouth daily.   metoprolol tartrate 50 MG tablet Commonly known as:  LOPRESSOR Take 50 mg by mouth 2 (two) times daily.   PRESERVISION AREDS 2 Caps Take 1 capsule by mouth 2 (two) times daily.   VITAMIN D-1000 MAX ST 1000 units tablet Generic drug:  Cholecalciferol Take 1,000 Units by mouth daily.      Allergies  Allergen Reactions  . Penicillin G Itching   Follow-up Information    [facility]. Call in 1 day(s).   Specialty:  Neurology Why:  Please call for a post hospital follow-up appointment Contact information: [address] [place] [address] [phone]       [facility]. Call in 1 day(s).   Why:  Please call for a post hospital follow-up appointment Contact  information: [address] [place] [address] [phone]           The results of significant diagnostics from this hospitalization (including imaging, microbiology, ancillary and laboratory) are listed below for reference.    Significant Diagnostic Studies: Dg Chest 2 View  Result Date: 06/09/2018 CLINICAL DATA:  Increased confusion EXAM: CHEST - 2 VIEW COMPARISON:  None. FINDINGS: Cardiac shadow is at the upper limits of normal in size. The lungs are well aerated bilaterally on the frontal film but somewhat hypoaerated on the lateral suggesting basilar atelectasis. This is felt to be related to the poor inspiratory effort. No bony abnormality is seen. Degenerative change of thoracic spine is noted. IMPRESSION: Poor inspiratory effort on the lateral projection. No acute abnormality noted. Electronically Signed   By: Alcide Clever M.D.   On: 06/09/2018 16:11   Ct Head Barrett Contrast  Result Date: 06/09/2018 CLINICAL DATA:  Focal neuro deficit. Altered mental status confusion EXAM: CT HEAD WITHOUT CONTRAST TECHNIQUE: Contiguous axial images were obtained from the base of the skull through the vertex without intravenous contrast. COMPARISON:  CT head 12/25/2017 FINDINGS: Brain: Progressive moderate atrophy. Progression of diffuse white matter hypodensity. No mass-effect or midline shift. Negative for acute infarct, hemorrhage, or mass. Sella remains markedly enlarged and filled with CSF. No pituitary tissue identified. Findings consistent with empty sella. Vascular: Negative for hyperdense vessel Skull: Negative Sinuses/Orbits: Paranasal sinuses clear.  Normal orbit. Other: None IMPRESSION: Since 01/14/2018, there has been progression of atrophy and diffuse white matter disease. No acute abnormality. Consider MRI head without and with contrast for further evaluation. Electronically Signed   By: Marlan Palau M.D.   On: 06/09/2018 14:01   Ct Knee Right Barrett Contrast  Result  Date: 05/28/2018 CLINICAL DATA:  Knee trauma, tenderness or effusion or can't walk, initial exam. Right knee injury 4 days ago. EXAM: CT OF THE RIGHT KNEE WITHOUT CONTRAST TECHNIQUE: Multidetector CT imaging of the RIGHT knee was performed according to the standard protocol. Multiplanar CT image reconstructions were also generated. COMPARISON:  Radiographs 3 hours prior. FINDINGS: Bones/Joint/Cartilage No fracture or dislocation. Advanced tricompartmental spurring with spurring of the tibial spines. Prominent medial tibiofemoral and patellofemoral joint space narrowing. Intra-articular bodies anteriorly in the joint. Subchondral cysts at the intercondylar notch. Chronic bony fragmentation about the proximal tibia/fibular articulation. Small joint effusion without lipohemarthrosis. Ligaments Suboptimally assessed by CT. Muscles and Tendons No intramuscular hematoma. Patellar tendon is intact. Mild thickening of the quadriceps tendon which is likely chronic. Soft tissues Soft tissue edema anteriorly.  No confluent hematoma. IMPRESSION: 1. 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 Intra-articular body anteriorly. 2. Small  joint effusion. Electronically Signed   By: Narda Rutherford M.D.   On: 05/28/2018 01:25   Emily Barrett Contrast  Result Date: 06/10/2018 CLINICAL DATA:  Speech changes EXAM: MRI HEAD WITHOUT CONTRAST TECHNIQUE: Multiplanar, multiecho pulse sequences of the brain and surrounding structures were obtained without intravenous contrast. COMPARISON:  Brain MRI 06/09/2018 FINDINGS: BRAIN: There is no acute infarct, acute hemorrhage, hydrocephalus or extra-axial collection. 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 There are no old infarcts. Diffuse confluent hyperintense T2-weighted signal within the periventricular, deep and juxtacortical white matter, most commonly due to chronic ischemic  microangiopathy. Generalized atrophy without lobar predilection. Susceptibility-sensitive sequences show no chronic microhemorrhage or superficial siderosis. VASCULAR: Major intracranial arterial and venous sinus flow voids are normal. SKULL AND UPPER CERVICAL SPINE: Calvarial bone marrow signal is normal. There is no skull base mass. Visualized upper cervical spine and soft tissues are normal. SINUSES/ORBITS: There are bilateral mastoid effusions. The orbits are normal. IMPRESSION: 1. No acute intracranial abnormality. 2. 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 Electronically Signed   By: Deatra Robinson M.D.   On: 06/10/2018 22:34   Emily Barrett Contrast  Result Date: 06/09/2018 CLINICAL DATA:  77 y/o F; altered mental status and difficulty speaking. EXAM: MRI HEAD WITHOUT CONTRAST MRA HEAD WITHOUT CONTRAST TECHNIQUE: Multiplanar, multiecho pulse sequences of the brain and surrounding structures were obtained without intravenous contrast. Angiographic images of the head were obtained using MRA technique without contrast. COMPARISON:  06/09/2018 CT head. FINDINGS: MRI HEAD FINDINGS Brain: No acute infarction, hemorrhage, hydrocephalus, extra-axial collection or mass lesion. Empty sella turcica with expansile CSF signal cystic structure containing a thin membrane extending into the clivus as seen on prior studies, possibly a arachnoid cyst or meningocele. Patchy confluent nonspecific T2 FLAIR hyperintensities in subcortical and periventricular white matter as well as the pons are compatible with advanced chronic microvascular ischemic changes for age. Moderate volume loss of the brain. Vascular: As below. Skull and upper cervical spine: Normal marrow signal. Sinuses/Orbits: Bilateral mastoid air cell opacification. No abnormal signal of the paranasal sinuses. Bilateral intra-ocular lens replacement. Other: None.  MRA HEAD FINDINGS Internal carotid arteries:  Patent. Anterior cerebral arteries: Patent. Bilateral A2 irregularity multiple segments of high-grade stenosis. Middle cerebral arteries: Patent. Left M2 inferior division proximal segment of high-grade stenosis. Anterior communicating artery: Patent. Posterior communicating arteries:  Patent. Posterior cerebral arteries: Patent. Segments of mild distal P2/P3 stenosis bilaterally. Basilar artery:  Patent.  Fenestrated lower basilar artery. Vertebral arteries:  Patent. Mild motion artifact may accentuate areas of stenosis as described above. No large vessel occlusion or aneurysm identified. IMPRESSION: MRI head: 1. No acute intracranial abnormality identified. 2. Advanced chronic microvascular ischemic changes and moderate volume loss of the brain for age. 3. 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 MRA head: 1. Motion artifact. 2. No large vessel occlusion or aneurysm. 3. Multiple segments of stenosis greatest in the bilateral A2 and left proximal M2 inferior division. Electronically Signed   By: Mitzi Hansen M.D.   On: 06/09/2018 22:33   Dg Knee Complete 4 Views Right  Result Date: 05/27/2018 CLINICAL DATA:  Pain. EXAM: RIGHT KNEE - COMPLETE 4+ VIEW COMPARISON:  None. FINDINGS: No significant joint effusion. Moderate to severe tricompartmental degenerative changes. No acute fractures noted. IMPRESSION: Moderate to severe tricompartmental degenerative changes. Electronically Signed  By: Gerome Sam III M.D   On: 05/27/2018 21:36   Emily Emily Barrett Head Barrett Contrast  Result Date: 06/09/2018 CLINICAL DATA:  77 y/o F; altered mental status and difficulty speaking. EXAM: MRI HEAD WITHOUT CONTRAST MRA HEAD WITHOUT CONTRAST TECHNIQUE: Multiplanar, multiecho pulse sequences of the brain and surrounding structures were obtained without intravenous contrast. Angiographic images of the head were  obtained using MRA technique without contrast. COMPARISON:  06/09/2018 CT head. FINDINGS: MRI HEAD FINDINGS Brain: No acute infarction, hemorrhage, hydrocephalus, extra-axial collection or mass lesion. Empty sella turcica with expansile CSF signal cystic structure containing a thin membrane extending into the clivus as seen on prior studies, possibly a arachnoid cyst or meningocele. Patchy confluent nonspecific T2 FLAIR hyperintensities in subcortical and periventricular white matter as well as the pons are compatible with advanced chronic microvascular ischemic changes for age. Moderate volume loss of the brain. Vascular: As below. Skull and upper cervical spine: Normal marrow signal. Sinuses/Orbits: Bilateral mastoid air cell opacification. No abnormal signal of the paranasal sinuses. Bilateral intra-ocular lens replacement. Other: None. MRA HEAD FINDINGS Internal carotid arteries:  Patent. Anterior cerebral arteries: Patent. Bilateral A2 irregularity multiple segments of high-grade stenosis. Middle cerebral arteries: Patent. Left M2 inferior division proximal segment of high-grade stenosis. Anterior communicating artery: Patent. Posterior communicating arteries:  Patent. Posterior cerebral arteries: Patent. Segments of mild distal P2/P3 stenosis bilaterally. Basilar artery:  Patent.  Fenestrated lower basilar artery. Vertebral arteries:  Patent. Mild motion artifact may accentuate areas of stenosis as described above. No large vessel occlusion or aneurysm identified. IMPRESSION: MRI head: 1. No acute intracranial abnormality identified. 2. Advanced chronic microvascular ischemic changes and moderate volume loss of the brain for age. 3. 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 MRA head: 1. Motion artifact. 2. No large vessel occlusion or aneurysm. 3. Multiple segments of stenosis greatest in the bilateral A2 and left proximal M2 inferior division.  Electronically Signed   By: Mitzi Hansen M.D.   On: 06/09/2018 22:33    Microbiology: No results found for this or any previous visit (from the past 240 hour(s)).   Labs: Basic Metabolic Panel: Recent Labs  Lab 06/09/18 1339  NA 141  K 4.2  CL 105  CO2 26  GLUCOSE 103*  BUN 19  CREATININE 0.61  CALCIUM 9.8   Liver Function Tests: Recent Labs  Lab 06/09/18 1339  AST 21  ALT 17  ALKPHOS 60  BILITOT 0.8  PROT 7.8  ALBUMIN 4.0   No results for input(s): LIPASE, AMYLASE in the last 168 hours. No results for input(s): AMMONIA in the last 168 hours. CBC: Recent Labs  Lab 06/09/18 1339  WBC 7.3  NEUTROABS 2.6  HGB 14.9  HCT 47.9*  MCV 91.6  PLT 284   Cardiac Enzymes: Recent Labs  Lab 06/09/18 1339  TROPONINI <0.03   BNP: BNP (last 3 results) No results for input(s): BNP in the last 8760 hours.  ProBNP (last 3 results) No results for input(s): PROBNP in the last 8760 hours.  CBG: Recent Labs  Lab 06/09/18 1328  GLUCAP 99       Signed:  Darlin Drop, MD [facility] 06/11/2018, 12:55 PM

## 2018-06-11 NOTE — Care Management Obs Status (Signed)
MEDICARE OBSERVATION STATUS NOTIFICATION   Patient Details  Name: Emily Barrett MRN: [id number] Date of Birth: 06/05/1941   Medicare Observation Status Notification Given:  Yes    Kermit Balo, RN 06/11/2018, 1:59 PM

## 2018-06-11 NOTE — Progress Notes (Addendum)
Subjective: Patient had a second staring spell while in the hospital around 5:55 PM last night.  Currently she is back to her baseline.  Exam: Vitals:   06/11/18 0339 06/11/18 1010  BP: 103/89 (!) 181/82  Pulse: 63 81  Resp: 18 18  Temp: 98.1 F (36.7 C) 99 F (37.2 C)  SpO2: 99% 99%    Physical Exam  HEENT-  Normocephalic, no lesions, without obvious abnormality.  Normal external eye and conjunctiva.   Musculoskeletal-no joint tenderness, deformity or swelling Skin-warm and dry, no hyperpigmentation, vitiligo, or suspicious lesions  Neuro:  Mental Status: Alert, oriented.  Speech fluent without evidence of aphasia.  Able to follow 3 step commands without difficulty. Cranial Nerves: II:  Visual fields grossly normal,  III,IV, VI: ptosis not present, extra-ocular motions intact bilaterally pupils equal, round, reactive to light and accommodation V,VII: smile symmetric, facial light touch sensation normal bilaterally VIII: Severely decreased auditory acuity to voice IX,X: uvula midline XI: bilateral shoulder shrug XII: midline tongue extension Motor: Right : Upper extremity   5/5    Left:     Upper extremity   5/5  Lower extremity   5/5     Lower extremity   5/5 Tone and bulk:normal tone throughout; no atrophy noted Sensory: Pinprick and light touch intact throughout, bilaterally Deep Tendon Reflexes: 2+ and symmetric throughout upper extremities and patellae Plantars: Right: downgoing   Left: downgoing Gait: Deferred    Medications:  Prior to Admission:  Medications Prior to Admission  Medication Sig Dispense Refill Last Dose  . aspirin EC 81 MG tablet Take by mouth.   06/09/2018 at Unknown time  . azelastine (ASTELIN) 0.1 % nasal spray Place 1 spray into the nose 2 (two) times daily.   Past Week at Unknown time  . calcium carbonate (CALCIUM 600) 600 MG TABS tablet Take 600 mg by mouth 2 (two) times daily.   06/09/2018 at Unknown time  . Cholecalciferol (VITAMIN D-1000  MAX ST) 1000 units tablet Take 1,000 Units by mouth daily.   06/09/2018 at Unknown time  . fexofenadine (ALLEGRA) 180 MG tablet Take 180 mg by mouth daily as needed for allergies or rhinitis.   Past Week at Unknown time  . hydrochlorothiazide (HYDRODIURIL) 12.5 MG tablet Take by mouth.   06/09/2018 at Unknown time  . lisinopril (PRINIVIL,ZESTRIL) 40 MG tablet Take 40 mg by mouth daily.    06/09/2018 at Unknown time  . metoprolol tartrate (LOPRESSOR) 50 MG tablet Take 50 mg by mouth 2 (two) times daily.    06/09/2018 at am  . Multiple Vitamins-Minerals (PRESERVISION AREDS 2) CAPS Take 1 capsule by mouth 2 (two) times daily.   06/09/2018 at Unknown time  . naproxen (NAPROSYN) 500 MG tablet Take 1 tablet (500 mg total) by mouth 2 (two) times daily as needed. 30 tablet 0 06/07/2018   Scheduled: . aspirin  300 mg Rectal Daily   Or  . aspirin  325 mg Oral Daily  . atorvastatin  80 mg Oral q1800  . enoxaparin (LOVENOX) injection  40 mg Subcutaneous Q24H  . hydrochlorothiazide  12.5 mg Oral Daily  . lisinopril  40 mg Oral Daily  . metoprolol tartrate  50 mg Oral BID    Pertinent Labs/Diagnostics: EEG: IMPRESSION: Normal electroencephalogram, awake and drowsy. There are no focal lateralizing or epileptiform features  Mri/ Mra Head Wo Contrast Result Date: 06/09/2018  IMPRESSION: MRI head: 1. No acute intracranial abnormality identified. 2. Advanced chronic microvascular ischemic changes and moderate volume loss  of the brain for age. 3. 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 MRA head: 1. Motion artifact. 2. No large vessel occlusion or aneurysm. 3. Multiple segments of stenosis greatest in the bilateral A2 and left proximal M2 inferior division. Electronically Signed   By: Mitzi Hansen M.D.   On: 06/09/2018 22:33    Felicie Morn PA-C [facility] [phone]   Assessment: 77 year old female presenting with  transient episodes of behavioral arrest along with staring spells.  She is completely amnestic of the episodes. 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 Alene Mires consult note.  1. Further diagnostic tests show no acute infarct, along with EEG showing no epileptiform activity.  However, the patient did have another amnestic episode last night which would make 2 episodes, fitting criteria for recurrent seizure.  Thus at this time starting antiepileptic medication is indicated.   2. Regarding other DDx of TIA, her MRI was negative and while ruling out a stroke, it does not rule out TIA. Although we feel that TIA is significantly less likely than seizure, it cannot be fully ruled out, thus will add Plavix to her low-dose daily aspirin. 3. Both daughters are in room and were educated regarding the patient's condition. 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  Recommendations: - We will bolus with Keppra 1 g and then continue 500 mg twice daily - Add Plavix 75 mg daily to her aspirin 81 mg daily - Follow-up with outpatient neurology  Thank you very much for this consult. At this time neurology will sign off.  Feel free to call with any questions.  Electronically signed: Dr. Caryl Pina 06/11/2018, 10:47 AM

## 2018-06-11 NOTE — Progress Notes (Signed)
NURSING PROGRESS NOTE  Emily Barrett [id number] Discharge Data: 06/11/2018 5:44 PM Attending Provider: Darlin Drop, DO [id number], No Pcp Per     Doreene Eland to be D/C'd Home per MD order.  Discussed with the patient the After Visit Summary and all questions fully answered. All IV's discontinued with no bleeding noted. All belongings returned to patient for patient to take home.   Last Vital Signs:  Blood pressure (!) 153/81, pulse 63, temperature 99 F (37.2 C), temperature source Oral, resp. rate 18, height 5\' 5"  (1.651 m), weight 94.3 kg, SpO2 96 %.  Discharge Medication List Allergies as of 06/11/2018      Reactions   Penicillin G Itching      Medication List    STOP taking these medications   naproxen 500 MG tablet Commonly known as:  NAPROSYN     TAKE these medications   aspirin 81 MG EC tablet Take 1 tablet (81 mg total) by mouth daily. What changed:    how much to take  when to take this   atorvastatin 40 MG tablet Commonly known as:  LIPITOR Take 1 tablet (40 mg total) by mouth daily at 6 PM.   azelastine 0.1 % nasal spray Commonly known as:  ASTELIN Place 1 spray into the nose 2 (two) times daily.   CALCIUM 600 600 MG Tabs tablet Generic drug:  calcium carbonate Take 600 mg by mouth 2 (two) times daily.   clopidogrel 75 MG tablet Commonly known as:  PLAVIX Take 1 tablet (75 mg total) by mouth daily.   fexofenadine 180 MG tablet Commonly known as:  ALLEGRA Take 180 mg by mouth daily as needed for allergies or rhinitis.   hydrochlorothiazide 12.5 MG tablet Commonly known as:  HYDRODIURIL Take by mouth.   levETIRAcetam 500 MG tablet Commonly known as:  KEPPRA Take 1 tablet (500 mg total) by mouth 2 (two) times daily.   lisinopril 40 MG tablet Commonly known as:  PRINIVIL,ZESTRIL Take 40 mg by mouth daily.   metoprolol tartrate 50 MG tablet Commonly known as:  LOPRESSOR Take 50 mg by mouth 2 (two) times daily.   PRESERVISION AREDS 2  Caps Take 1 capsule by mouth 2 (two) times daily.   VITAMIN D-1000 MAX ST 1000 units tablet Generic drug:  Cholecalciferol Take 1,000 Units by mouth daily.

## 2018-07-01 ENCOUNTER — Encounter: Payer: Self-pay | Admitting: Neurology

## 2018-07-01 ENCOUNTER — Telehealth: Payer: Self-pay

## 2018-07-01 ENCOUNTER — Ambulatory Visit: Payer: Medicare Other | Admitting: Neurology

## 2018-07-01 NOTE — Telephone Encounter (Signed)
PT no show for appt today. 

## 2020-07-21 ENCOUNTER — Emergency Department (HOSPITAL_BASED_OUTPATIENT_CLINIC_OR_DEPARTMENT_OTHER)
Admission: EM | Admit: 2020-07-21 | Discharge: 2020-07-22 | Disposition: A | Discharge status: Home / Self Care | Payer: Medicare Other | Attending: Emergency Medicine | Admitting: Emergency Medicine

## 2020-07-21 ENCOUNTER — Other Ambulatory Visit: Payer: Self-pay

## 2020-07-21 ENCOUNTER — Encounter (HOSPITAL_BASED_OUTPATIENT_CLINIC_OR_DEPARTMENT_OTHER): Payer: Self-pay | Admitting: *Deleted

## 2020-07-21 DIAGNOSIS — Z8673 Personal history of transient ischemic attack (TIA), and cerebral infarction without residual deficits: Secondary | ICD-10-CM | POA: Diagnosis not present

## 2020-07-21 DIAGNOSIS — I10 Essential (primary) hypertension: Secondary | ICD-10-CM | POA: Insufficient documentation

## 2020-07-21 DIAGNOSIS — E876 Hypokalemia: Secondary | ICD-10-CM | POA: Diagnosis not present

## 2020-07-21 DIAGNOSIS — Z79899 Other long term (current) drug therapy: Secondary | ICD-10-CM | POA: Insufficient documentation

## 2020-07-21 DIAGNOSIS — Z87891 Personal history of nicotine dependence: Secondary | ICD-10-CM | POA: Diagnosis not present

## 2020-07-21 DIAGNOSIS — T502X5A Adverse effect of carbonic-anhydrase inhibitors, benzothiadiazides and other diuretics, initial encounter: Secondary | ICD-10-CM | POA: Diagnosis not present

## 2020-07-21 DIAGNOSIS — R55 Syncope and collapse: Secondary | ICD-10-CM | POA: Diagnosis present

## 2020-07-21 NOTE — ED Triage Notes (Signed)
She was standing from the kitchen table, lost her balance and fell. Hx of dementia. She does not admit to pain. She is alert but confused per norm.

## 2020-07-22 ENCOUNTER — Emergency Department (HOSPITAL_BASED_OUTPATIENT_CLINIC_OR_DEPARTMENT_OTHER): Payer: Medicare Other

## 2020-07-22 DIAGNOSIS — R55 Syncope and collapse: Secondary | ICD-10-CM | POA: Diagnosis not present

## 2020-07-22 LAB — CBC WITH DIFFERENTIAL/PLATELET
Abs Immature Granulocytes: 0.03 10*3/uL (ref 0.00–0.07)
Basophils Absolute: 0 10*3/uL (ref 0.0–0.1)
Basophils Relative: 0 %
Eosinophils Absolute: 0.1 10*3/uL (ref 0.0–0.5)
Eosinophils Relative: 1 %
HCT: 39.4 % (ref 36.0–46.0)
Hemoglobin: 12.9 g/dL (ref 12.0–15.0)
Immature Granulocytes: 0 %
Lymphocytes Relative: 36 %
Lymphs Abs: 3.3 10*3/uL (ref 0.7–4.0)
MCH: 28.8 pg (ref 26.0–34.0)
MCHC: 32.7 g/dL (ref 30.0–36.0)
MCV: 87.9 fL (ref 80.0–100.0)
Monocytes Absolute: 0.7 10*3/uL (ref 0.1–1.0)
Monocytes Relative: 7 %
Neutro Abs: 5.1 10*3/uL (ref 1.7–7.7)
Neutrophils Relative %: 56 %
Platelets: 318 10*3/uL (ref 150–400)
RBC: 4.48 MIL/uL (ref 3.87–5.11)
RDW: 14.7 % (ref 11.5–15.5)
WBC: 9.2 10*3/uL (ref 4.0–10.5)
nRBC: 0 % (ref 0.0–0.2)

## 2020-07-22 LAB — BASIC METABOLIC PANEL
Anion gap: 10 (ref 5–15)
BUN: 25 mg/dL — ABNORMAL HIGH (ref 8–23)
CO2: 25 mmol/L (ref 22–32)
Calcium: 9.1 mg/dL (ref 8.9–10.3)
Chloride: 101 mmol/L (ref 98–111)
Creatinine, Ser: 0.75 mg/dL (ref 0.44–1.00)
GFR, Estimated: >60 mL/min (ref >60)
Glucose, Bld: 164 mg/dL — ABNORMAL HIGH (ref 70–99)
Potassium: 3 mmol/L — ABNORMAL LOW (ref 3.5–5.1)
Sodium: 136 mmol/L (ref 135–145)

## 2020-07-22 MED ORDER — POTASSIUM CHLORIDE CRYS ER 20 MEQ PO TBCR: EXTENDED_RELEASE_TABLET | ORAL | 0 refills | Status: AC

## 2020-07-22 MED ORDER — POTASSIUM CHLORIDE CRYS ER 20 MEQ PO TBCR
40.0000 meq | EXTENDED_RELEASE_TABLET | Freq: Once | ORAL | Status: AC
  Administered 2020-07-22: 40 meq via ORAL
  Filled 2020-07-22: qty 2

## 2020-07-22 NOTE — ED Notes (Signed)
Pt was getting up from the table and passed out, has been having pain recently in the back of head and neck. Family states she hit her head on the floor, family also states there was some delay in her returning to baseline. Pt states currently has no pain, she endorses having intermittent pain at the base of skull , occipital area, denies any vision issues, but has been having some vertigo as per family.

## 2020-07-22 NOTE — ED Notes (Signed)
BEFAST assessment is negative.  

## 2020-07-22 NOTE — ED Notes (Signed)
ED Provider at bedside. 

## 2020-07-22 NOTE — ED Notes (Signed)
Patient transported to CT 

## 2020-07-22 NOTE — Discharge Instructions (Addendum)
Return if you are having any problems. 

## 2020-07-22 NOTE — ED Provider Notes (Signed)
[facility] EMERGENCY DEPARTMENT Provider Note   CSN: [id number] Arrival date & time: 07/21/20  1941   History Chief Complaint  Patient presents with  . Fall    Emily Barrett is a 79 y.o. female.  The history is provided by the patient and a relative.  Fall  She has history of hypertension, transient ischemic attack and comes in following a syncopal episode.  She was watching her grandchildren play dominoes and she stood up in the next thing she knew, she was on the floor.  Loss of consciousness was estimated at about 2 minutes.  She denies feeling dizzy or lightheaded.  She denies any chest pain, heaviness, tightness, pressure.  She has had falls in the past related to vertigo. She is back to her baseline now.  Past Medical History:  Diagnosis Date  . Hypertension     Patient Active Problem List   Diagnosis Date Noted  . TIA (transient ischemic attack) 06/09/2018  . Acute metabolic encephalopathy 06/09/2018  . Essential hypertension 06/09/2018    Past Surgical History:  Procedure Laterality Date  . ABDOMINAL HYSTERECTOMY    . CHOLECYSTECTOMY       OB History   No obstetric history on file.     Family History  Problem Relation Age of Onset  . Dementia Mother   . Heart disease Father   . Diabetes Mellitus II Brother   . Hypertension Brother     Social History   Tobacco Use  . Smoking status: Former Games developer  . Smokeless tobacco: Never Used  Vaping Use  . Vaping Use: Never used  Substance Use Topics  . Alcohol use: Not Currently  . Drug use: Not Currently    Home Medications Prior to Admission medications   Medication Sig Start Date End Date Taking? Authorizing Provider  donepezil (ARICEPT) 5 MG tablet Take by mouth. 04/26/20  Yes [provider]  famotidine (PEPCID) 20 MG tablet TAKE 1 TABLET(20 MG) BY MOUTH TWICE DAILY AS NEEDED FOR HEARTBURN 05/12/19  Yes [provider]  fluticasone (FLONASE) 50 MCG/ACT nasal spray SHAKE  LIQUID AND USE 2 SPRAYS IN EACH NOSTRIL DAILY 12/23/19  Yes [provider]  hydrochlorothiazide (HYDRODIURIL) 25 MG tablet Take by mouth. 06/14/19  Yes [provider]  memantine (NAMENDA) 10 MG tablet TAKE 1/2 TABLET BY MOUTH DAILY FOR 1 WEEK, THEN 1/2 TWICE DAILY FOR 1 WEEK, THEN 1/2 IN AM AND 1 IN PM FOR 1 WEEK, THEN 1 TWICE DAILY 04/05/20  Yes [provider]  potassium chloride SA (KLOR-CON) 20 MEQ tablet Take by mouth. 12/23/19  Yes [provider]  sertraline (ZOLOFT) 25 MG tablet Take by mouth. 12/22/19  Yes [provider]  aspirin EC 81 MG EC tablet Take 1 tablet (81 mg total) by mouth daily. 06/11/18   Darlin Drop, DO  atorvastatin (LIPITOR) 40 MG tablet Take 1 tablet (40 mg total) by mouth daily at 6 PM. 06/11/18   Darlin Drop, DO  azelastine (ASTELIN) 0.1 % nasal spray Place 1 spray into the nose 2 (two) times daily. 11/14/17   [provider]  Calcium Carb-Cholecalciferol 500-400 MG-UNIT CHEW Chew 1 tablet by mouth 2 (two) times daily.    [provider]  calcium carbonate (CALCIUM 600) 600 MG TABS tablet Take 600 mg by mouth 2 (two) times daily.    [provider]  calcium carbonate (OSCAL) 1500 (600 Ca) MG TABS tablet Take by mouth.    [provider]  Cholecalciferol (VITAMIN D-1000 MAX ST) 1000 units tablet Take 1,000 Units by mouth daily.    [provider]  clopidogrel (PLAVIX) 75 MG tablet Take 1 tablet (75 mg total) by mouth daily. 06/11/18   Darlin Drop, DO  fexofenadine (ALLEGRA) 180 MG tablet Take 180 mg by mouth daily as needed for allergies or rhinitis.    [provider]  hydrochlorothiazide (HYDRODIURIL) 12.5 MG tablet Take by mouth.    [provider]  levETIRAcetam (KEPPRA) 500 MG tablet Take 1 tablet (500 mg total) by mouth 2 (two) times daily. 06/11/18   Darlin Drop, DO  lisinopril (PRINIVIL,ZESTRIL) 40 MG tablet Take 40 mg by mouth daily.  05/14/18    [provider]  metoprolol tartrate (LOPRESSOR) 50 MG tablet Take 50 mg by mouth 2 (two) times daily.  12/25/17   [provider]  Multiple Vitamins-Minerals (PRESERVISION AREDS 2) CAPS Take 1 capsule by mouth 2 (two) times daily.    [provider]    Allergies    Penicillin g  Review of Systems   Review of Systems  All other systems reviewed and are negative.   Physical Exam Updated Vital Signs BP 129/65   Pulse 67   Temp 98.6 F (37 C) (Oral)   Resp 18   Ht 5\' 5"  (1.651 m)   Wt 94.3 kg   SpO2 98%   BMI 34.60 kg/m   Physical Exam Vitals and nursing note reviewed.   79 year old female, resting comfortably and in no acute distress. Vital signs are normal. Oxygen saturation is 98%, which is normal. Head is normocephalic and atraumatic. PERRLA, EOMI. Oropharynx is clear. Neck is nontender and supple without adenopathy or JVD. Back is nontender and there is no CVA tenderness. Lungs are clear without rales, wheezes, or rhonchi. Chest is nontender. Heart has regular rate and rhythm without murmur. Abdomen is soft, flat, nontender without masses or hepatosplenomegaly and peristalsis is normoactive. Extremities have no cyanosis or edema, full range of motion is present. Skin is warm and dry without rash. Neurologic: Mental status is normal, cranial nerves are intact, there are no motor or sensory deficits.  ED Results / Procedures / Treatments   Labs (all labs ordered are listed, but only abnormal results are displayed) Labs Reviewed  BASIC METABOLIC PANEL - Abnormal; Notable for the following components:      Result Value   Potassium 3.0 (*)    Glucose, Bld 164 (*)    BUN 25 (*)    All other components within normal limits  CBC WITH DIFFERENTIAL/PLATELET    EKG EKG Interpretation  Date/Time:  Saturday July 22 2020 00:30:04 EST Ventricular Rate:  63 PR Interval:    QRS Duration: 95 QT Interval:  477 QTC Calculation: 489 R  Axis:   20 Text Interpretation: Sinus rhythm Atrial premature complexes RSR' in V1 or V2, right VCD or RVH Probable left ventricular hypertrophy Borderline prolonged QT interval When compared with ECG of 06/09/2018, Premature atrial complexes are now present Premature ventricular complexes are no longer present Confirmed by 06/11/2018 (Dione Booze) on 07/22/2020 12:57:00 AM   Radiology CT Head Wo Contrast  Result Date: 07/22/2020 CLINICAL DATA:  Syncope. Normal neurological examination. Baseline dementia. EXAM: CT HEAD WITHOUT CONTRAST TECHNIQUE: Contiguous axial images were obtained from the base of the skull through the vertex without intravenous contrast. COMPARISON:  CT head 06/09/2018.  MRI brain 10/24/2018 FINDINGS: Brain: No evidence of acute infarction, hemorrhage, hydrocephalus, extra-axial collection or mass  lesion/mass effect. Diffuse cerebral atrophy. Low-attenuation changes in the deep white matter consistent with small vessel ischemia. No change. Vascular: Mild intracranial arterial vascular calcifications. Skull: Calvarium appears intact. Prominence of the bony sella turcica with empty sella appearance. Possible prior trans-sphenoidal resection versus cystic lesion. No change in appearance. Sinuses/Orbits: Paranasal sinuses are clear. Partial opacification of the mastoid air cells bilaterally. Other: None. IMPRESSION: 1. No acute intracranial abnormalities. 2. Chronic atrophy and small vessel ischemia. 3. Prominence of the bony sella turcica with empty sella appearance. Possible prior trans-sphenoidal resection versus cystic lesion. No change in appearance. 4. Bilateral mastoid effusions. Electronically Signed   By: Burman Nieves M.D.   On: 07/22/2020 01:17    Procedures Procedures   Medications Ordered in ED Medications  potassium chloride SA (KLOR-CON) CR tablet 40 mEq (has no administration in time range)    ED Course  I have reviewed the triage vital signs and the nursing  notes.  Pertinent labs & imaging results that were available during my care of the patient were reviewed by me and considered in my medical decision making (see chart for details).  MDM Rules/Calculators/A&P Syncope, possibly orthostatic.  Old records are reviewed, and she has no relevant past visits.  Will check CT of head as well as ECG and screening labs.  1:32 AM ECG shows no acute changes.  CT of head shows no acute changes.  Labs are significant for hypokalemia, most likely secondary to diuretic use.  She is given a dose of oral potassium.  Orthostatic vital signs showed no significant change in pulse or blood pressure.  She is discharged with prescription for K-Dur and is referred back to her primary care provider for further outpatient work-up.  Final Clinical Impression(s) / ED Diagnoses Final diagnoses:  Syncope, unspecified syncope type  Diuretic-induced hypokalemia    Rx / DC Orders ED Discharge Orders         Ordered    potassium chloride SA (KLOR-CON) 20 MEQ tablet        07/22/20 0131           Dione Booze, MD 07/22/20 [id number]

## 2021-03-09 IMAGING — CT CT HEAD W/O CM
3 series · 14 of 47 positions shown, 16 images · non-contrast
Comparison: CT head 06/09/2018.  MRI brain 10/24/2018

CLINICAL DATA: Syncope. Normal neurological examination. Baseline
dementia.

EXAM:
CT HEAD WITHOUT CONTRAST
TECHNIQUE: Contiguous axial images were obtained from the base of the skull
through the vertex without intravenous contrast.

[Series 2: head wo · axial · 0.46mm/px · z∈[-329,-194]mm · 8 of 33 slices shown, 10 images]
[im 3/33  brain]
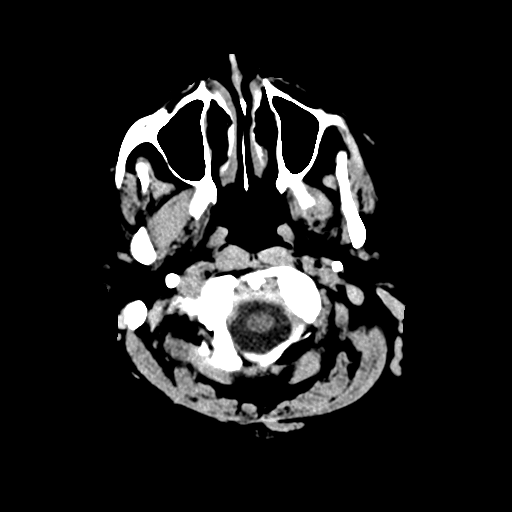
[im 3/33  bone]
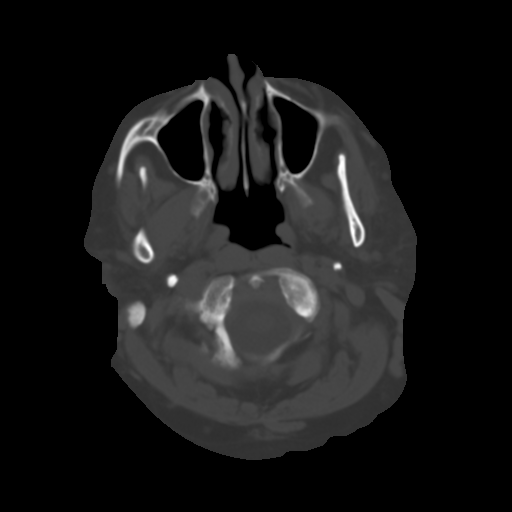
[im 7/33  brain]
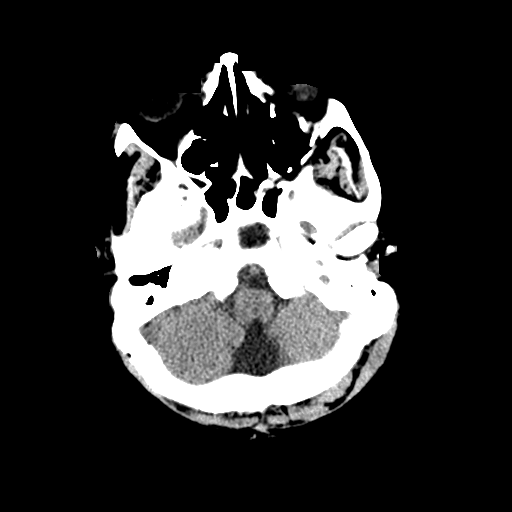
[im 10/33  brain]
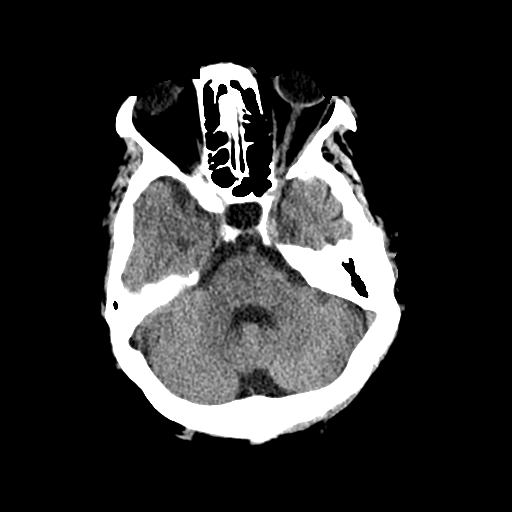
[im 15/33  brain]
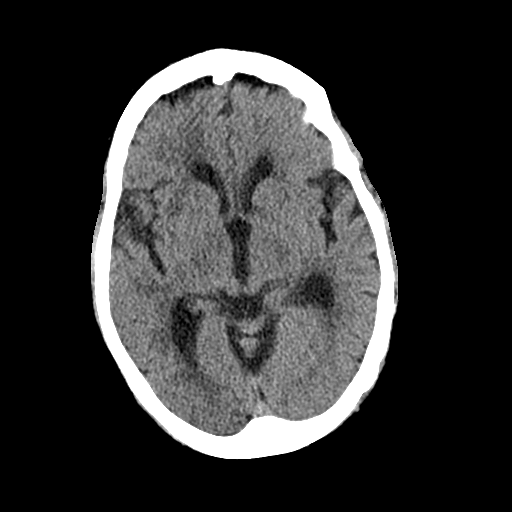
[im 18/33  brain]
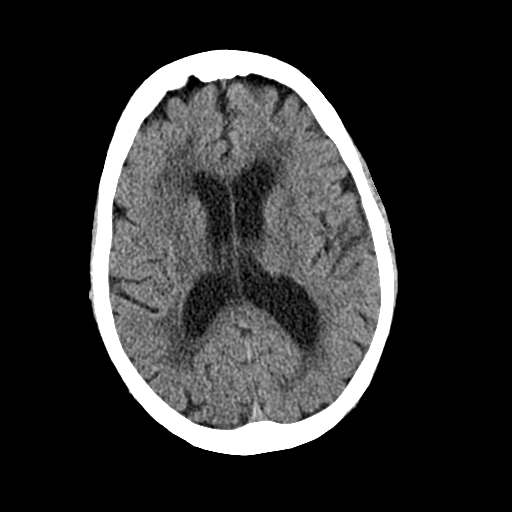
[im 18/33  bone]
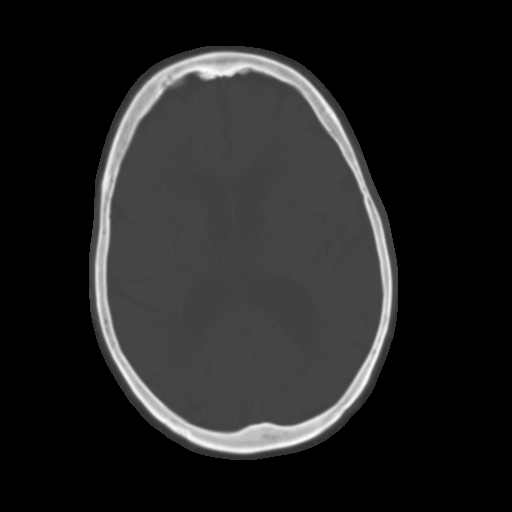
[im 23/33  brain]
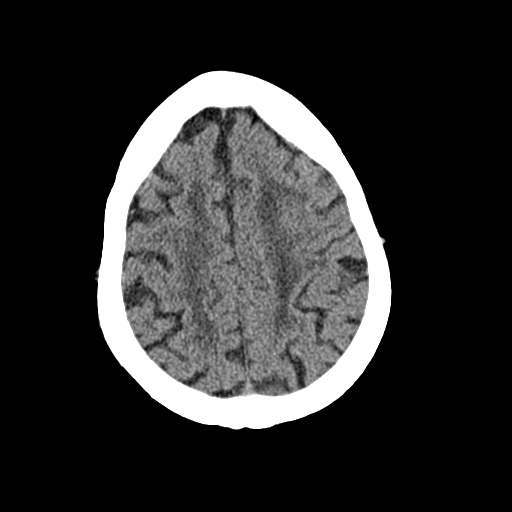
[im 26/33  brain]
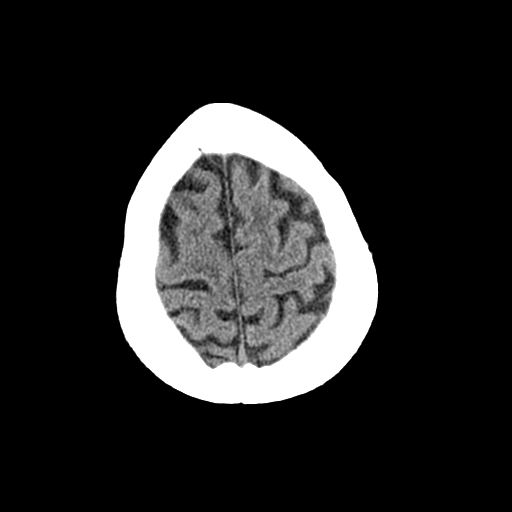
[im 30/33  brain]
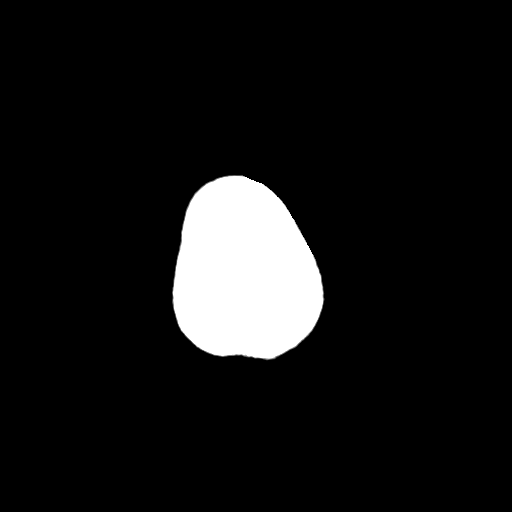

[Series 4: cor soft · coronal · 0.31mm/px · 3 of 72 slices shown]
[im 24/72  brain]
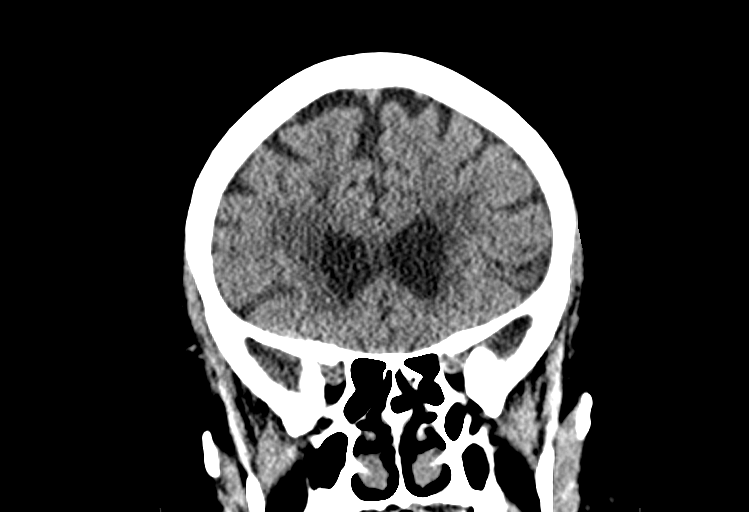
[im 32/72  brain]
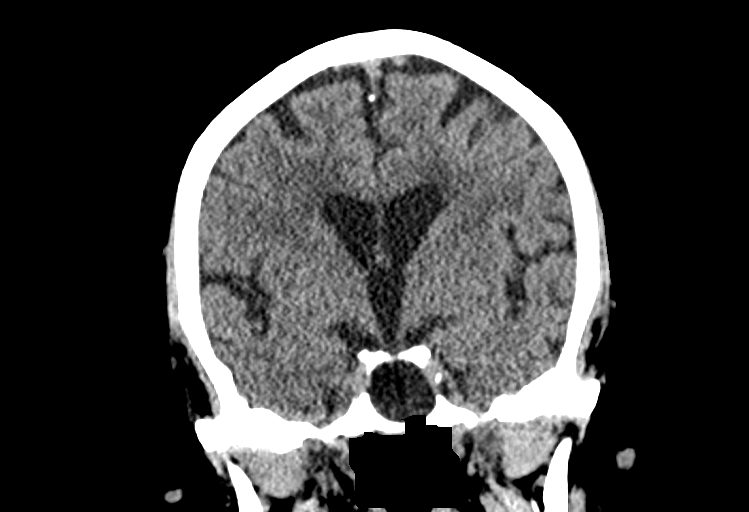
[im 40/72  brain]
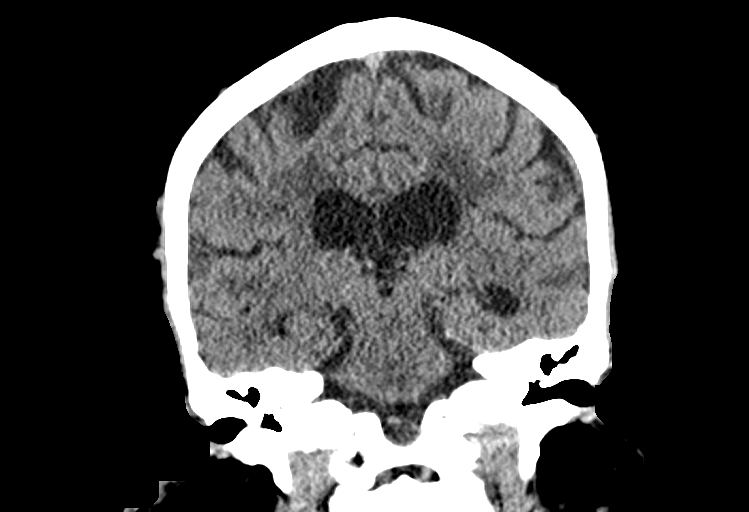

[Series 5: sag soft · sagittal · 0.31mm/px · 3 of 56 slices shown]
[im 19/56  brain]
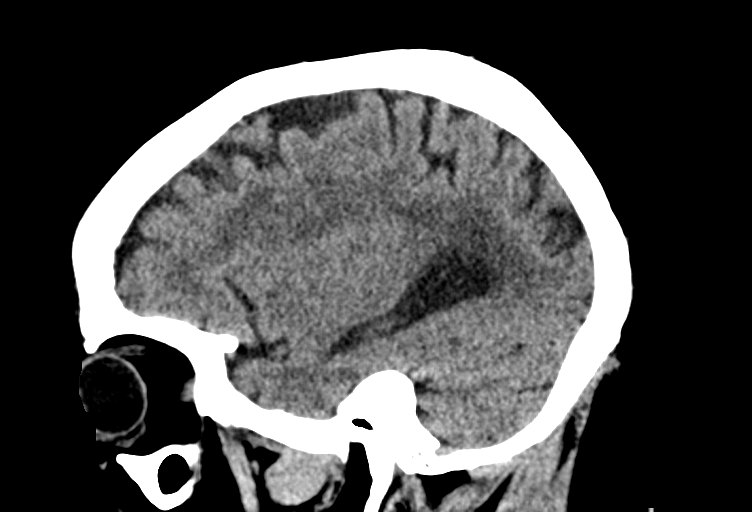
[im 28/56  brain]
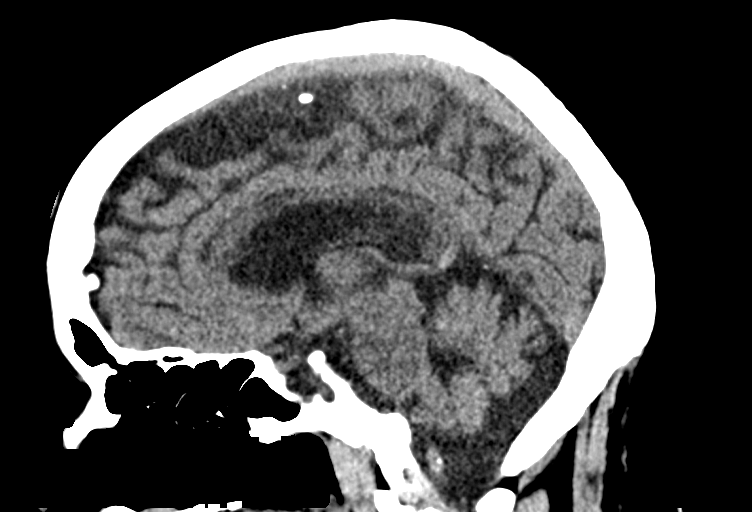
[im 37/56  brain]
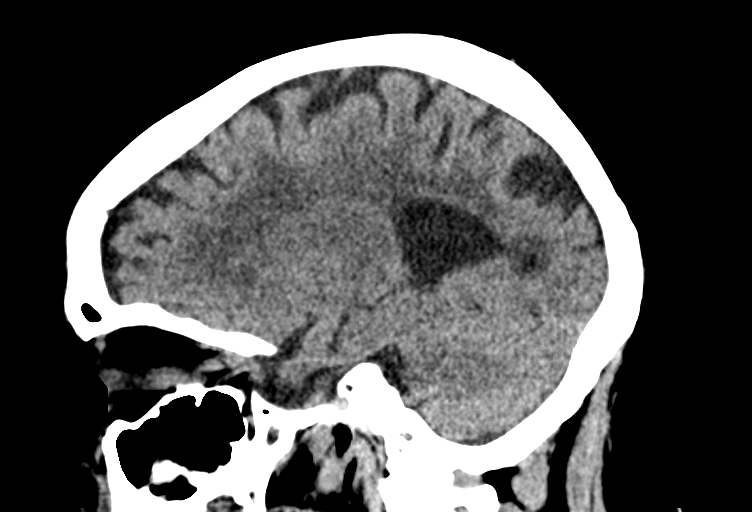

[14 of 47 positions shown; findings below may reference images not displayed]

FINDINGS: Brain: No evidence of acute infarction, hemorrhage, hydrocephalus,
extra-axial collection or mass lesion/mass effect. Diffuse cerebral
atrophy. Low-attenuation changes in the deep white matter consistent
with small vessel ischemia. No change.

Vascular: Mild intracranial arterial vascular calcifications.

Skull: Calvarium appears intact. Prominence of the bony sella
turcica with empty sella appearance. Possible prior trans-sphenoidal
resection versus cystic lesion. No change in appearance.

Sinuses/Orbits: Paranasal sinuses are clear. Partial opacification
of the mastoid air cells bilaterally.

Other: None.
IMPRESSION: 1. No acute intracranial abnormalities.
2. Chronic atrophy and small vessel ischemia.
3. Prominence of the bony sella turcica with empty sella appearance.
Possible prior trans-sphenoidal resection versus cystic lesion. No
change in appearance.
4. Bilateral mastoid effusions.
